# Patient Record
Sex: Male | Born: 1970 | Race: White | Hispanic: No | Marital: Married | Smoking: Former smoker
Health system: Southern US, Community
[De-identification: ages and names within clinical notes are randomized; demographics above are authoritative.]

## PROBLEM LIST (undated history)

## (undated) DIAGNOSIS — I484 Atypical atrial flutter: Secondary | ICD-10-CM

## (undated) DIAGNOSIS — Q249 Congenital malformation of heart, unspecified: Secondary | ICD-10-CM

## (undated) HISTORY — PX: OTHER SURGICAL HISTORY: SHX169

## (undated) HISTORY — DX: Atypical atrial flutter: I48.4

## (undated) HISTORY — PX: ABLATION: SHX5711

## (undated) HISTORY — DX: Congenital malformation of heart, unspecified: Q24.9

---

## 2005-11-02 HISTORY — PX: CARDIAC CATHETERIZATION: SHX172

## 2006-02-27 ENCOUNTER — Inpatient Hospital Stay (HOSPITAL_COMMUNITY): Admission: EM | Admit: 2006-02-27 | Discharge: 2006-03-02 | Payer: Self-pay | Admitting: *Deleted

## 2006-02-27 ENCOUNTER — Ambulatory Visit: Payer: Self-pay | Admitting: *Deleted

## 2006-03-11 ENCOUNTER — Ambulatory Visit: Payer: Self-pay | Admitting: Internal Medicine

## 2006-04-05 ENCOUNTER — Ambulatory Visit: Payer: Self-pay | Admitting: Internal Medicine

## 2006-04-06 ENCOUNTER — Observation Stay (HOSPITAL_COMMUNITY): Admission: AD | Admit: 2006-04-06 | Discharge: 2006-04-07 | Payer: Self-pay | Admitting: Internal Medicine

## 2006-04-06 ENCOUNTER — Ambulatory Visit: Payer: Self-pay | Admitting: Internal Medicine

## 2006-04-09 ENCOUNTER — Ambulatory Visit: Payer: Self-pay | Admitting: Cardiology

## 2006-04-20 ENCOUNTER — Ambulatory Visit: Payer: Self-pay | Admitting: Cardiology

## 2006-04-29 ENCOUNTER — Ambulatory Visit: Payer: Self-pay | Admitting: Internal Medicine

## 2010-10-20 ENCOUNTER — Telehealth (INDEPENDENT_AMBULATORY_CARE_PROVIDER_SITE_OTHER): Payer: Self-pay | Admitting: *Deleted

## 2010-12-04 NOTE — Progress Notes (Signed)
  Request received from [facility] [employer] sent to [employer]  October 20, 2010 9:20 AM

## 2014-05-28 ENCOUNTER — Ambulatory Visit (HOSPITAL_COMMUNITY)
Admission: RE | Admit: 2014-05-28 | Discharge: 2014-05-28 | Disposition: A | Discharge status: Home / Self Care | Payer: BC Managed Care – PPO | Source: Ambulatory Visit | Attending: Cardiology | Admitting: Cardiology

## 2014-05-28 ENCOUNTER — Telehealth: Payer: Self-pay

## 2014-05-28 ENCOUNTER — Ambulatory Visit (INDEPENDENT_AMBULATORY_CARE_PROVIDER_SITE_OTHER): Payer: BC Managed Care – PPO | Admitting: Cardiology

## 2014-05-28 ENCOUNTER — Encounter: Payer: Self-pay | Admitting: Cardiology

## 2014-05-28 VITALS — BP 135/80 | HR 85 | Ht 73.0 in | Wt 236.0 lb

## 2014-05-28 DIAGNOSIS — Z87891 Personal history of nicotine dependence: Secondary | ICD-10-CM | POA: Insufficient documentation

## 2014-05-28 DIAGNOSIS — Z9889 Other specified postprocedural states: Secondary | ICD-10-CM | POA: Insufficient documentation

## 2014-05-28 DIAGNOSIS — I079 Rheumatic tricuspid valve disease, unspecified: Secondary | ICD-10-CM | POA: Insufficient documentation

## 2014-05-28 DIAGNOSIS — I059 Rheumatic mitral valve disease, unspecified: Secondary | ICD-10-CM

## 2014-05-28 DIAGNOSIS — I4892 Unspecified atrial flutter: Secondary | ICD-10-CM

## 2014-05-28 DIAGNOSIS — I484 Atypical atrial flutter: Secondary | ICD-10-CM

## 2014-05-28 DIAGNOSIS — I10 Essential (primary) hypertension: Secondary | ICD-10-CM

## 2014-05-28 DIAGNOSIS — Q249 Congenital malformation of heart, unspecified: Secondary | ICD-10-CM | POA: Insufficient documentation

## 2014-05-28 DIAGNOSIS — I4891 Unspecified atrial fibrillation: Secondary | ICD-10-CM | POA: Insufficient documentation

## 2014-05-28 DIAGNOSIS — Z01810 Encounter for preprocedural cardiovascular examination: Secondary | ICD-10-CM | POA: Insufficient documentation

## 2014-05-28 DIAGNOSIS — I517 Cardiomegaly: Secondary | ICD-10-CM | POA: Insufficient documentation

## 2014-05-28 DIAGNOSIS — Z01818 Encounter for other preprocedural examination: Secondary | ICD-10-CM

## 2014-05-28 NOTE — Assessment & Plan Note (Signed)
Details are not clear. An echocardiogram is being obtained.

## 2014-05-28 NOTE — Telephone Encounter (Signed)
Patient self referred  Need clearance for surgery  Fax to [phone]   Patient hasnt been here

## 2014-05-28 NOTE — Patient Instructions (Signed)
Your physician recommends that you schedule a follow-up appointment in: to be determined  Your physician has requested that you have an echocardiogram. Echocardiography is a painless test that uses sound waves to create images of your heart. It provides your doctor with information about the size and shape of your heart and how well your heart's chambers and valves are working. This procedure takes approximately one hour. There are no restrictions for this procedure.    

## 2014-05-28 NOTE — Progress Notes (Signed)
Clinical Summary Mr. Spangle is a 43 y.o.male referred by Dr. Madelon Lipsaffrey with [facility] for preoperative consultation. He is being considered for elective arthroscopic right shoulder surgery this Wednesday, July 29 (2 days from now).  Limited old records are available, we were able to obtain some from [facility] archives. Patient has a reported history of congenital heart disease, undergoing surgery as a child in [place]. Previous notes are discrepant, one describing a history of Tetralogy of Fallot repair, the other describing repair of pulmonic stenosis only. He also has a previous history of radiofrequency ablation of atypical atrial flutter, has not required long-term anticoagulation. There has been no regular cardiology followup since 2007. No old ECG available for comparison, and could not locate previous echocardiogram report.  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 States that he is active with his family, enjoys mountain biking and also running (up to 5 miles at a time), with no major functional limitation. He has NYHA class I dyspnea, no exertional chest pain, no palpitations or syncope. He has no cyanosis, no orthopnea, no leg edema.  ECG today shows sinus rhythm with intermittent junctional beats, possible left atrial enlargement and borderline increased voltage, leftward axis.  No Known Allergies  No current prescription medications.  Past Medical History  Diagnosis Date  . Congenital heart disease     Reportedly either Tetralogy of Fallot repair or pulmonic stenosis repair in childhood [facility]([place]) - details not clear  . Atypical atrial flutter     Status post RFA by Dr. Ladona Ridgelaylor 2007    Past Surgical History  Procedure Laterality Date  . Congenital heart disease surgery      [place] - childhood    Family History  Problem Relation Age of Onset  . CAD Mother     At age 10057    Social History Mr. Margerum reports that he has quit smoking. His  smoking use included Cigarettes. He smoked 0.00 packs per day. He does not have any smokeless tobacco history on file. Mr. Lorenso Courierowers reports that he drinks alcohol.  Review of Systems Right shoulder discomfort after having a fall and bracing himself with his right arm. Other systems reviewed and negative.  Physical Examination Filed Vitals:   05/28/14 1314  BP: 135/80  Pulse: 85   Filed Weights   05/28/14 1314  Weight: 236 lb (107.049 kg)   Normally nourished appearing male, comfortable at rest. HEENT: Conjunctiva and lids normal, oropharynx clear. Neck: Supple, no elevated JVP or carotid bruits, no thyromegaly. Lungs: Clear to auscultation, nonlabored breathing at rest. Cardiac: Regular rate and rhythm, no S3, soft systolic murmur at left base, no obvious diastolic murmur, no pericardial rub. Abdomen: Soft, nontender, bowel sounds present, no guarding or rebound. Extremities: No pitting edema, distal pulses 2+. No cyanosis. No clubbing. Skin: Warm and dry. Musculoskeletal: No kyphosis. Neuropsychiatric: Alert and oriented x3, affect grossly appropriate.   Problem List and Plan   Preoperative clearance Patient being considered for elective right shoulder arthroscopic surgery this Wednesday. He reports no significant functional limitations as detailed above with activities including exercise well exceeding 4 METs. His ECG shows sinus rhythm with intermittent junctional beats. Prior cardiac history includes radiofrequency ablation of atypical atrial flutter which has not been a recurrent issue since 2007. He is no longer anticoagulated. Also history of congenital heart disease with incomplete information available. 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  He only had one operation. We will obtain an echocardiogram to get a better sense of cardiac structure and function. Unless there are any significant  residual shunts that would increase his propensity for cyanosis or hypoxia, or other valvular lesions that need further investigation, he should he be able to proceed with surgery on Wednesday. We will call him with the results and forward a copy of the echocardiogram to Dr. Madelon Lips.  Atypical atrial flutter Clinically stable following radiofrequency ablation in 2007 by Dr. Ladona Ridgel. No longer anticoagulated. He is in sinus rhythm today.  Congenital heart disease Details are not clear. An echocardiogram is being obtained.    Jonelle Sidle, M.D., F.A.C.C.

## 2014-05-28 NOTE — Telephone Encounter (Signed)
Please tell patient he needs to make an appointment to be seen and then we can do the referral that he needs to have for surgery. He may also need an okay from his cardiologist. What kind of surgery is going to have?

## 2014-05-28 NOTE — Assessment & Plan Note (Signed)
Clinically stable following radiofrequency ablation in 2007 by Dr. Ladona Ridgelaylor. No longer anticoagulated. He is in sinus rhythm today.

## 2014-05-28 NOTE — Assessment & Plan Note (Signed)
Patient being considered for elective right shoulder arthroscopic surgery this Wednesday. He reports no significant functional limitations as detailed above with activities including exercise well exceeding 4 METs. His ECG shows sinus rhythm with intermittent junctional beats. Prior cardiac history includes radiofrequency ablation of atypical atrial flutter which has not been a recurrent issue since 2007. He is no longer anticoagulated. Also history of congenital heart disease with incomplete information available. 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 He only had one operation. We will obtain an echocardiogram to get a better sense of cardiac structure and function. Unless there are any significant residual shunts that would increase his propensity for cyanosis or hypoxia, or other valvular lesions that need further investigation, he should he be able to proceed with surgery on Wednesday. We will call him with the results and forward a copy of the echocardiogram to Dr. Madelon Lipsaffrey.

## 2014-05-29 ENCOUNTER — Encounter: Payer: Self-pay | Admitting: Cardiology

## 2014-05-29 NOTE — Progress Notes (Signed)
Realized that an omission error occurred in my note from 05/28/2014 at 5:37 PM discussing the echocardiogram report. It should have read as follows:  Echocardiogram reviewed. It is possible that he had a Tetrology of Fallot repair, but difficult to be sure without more information or prior study. Importantly, there are no significant shunts (no VSD or ASD) and right ventricular function is normal. 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 No clear indication for SBE prophylaxis in this setting (not six months from heart surgery, no shunt noted, not procedure dealing with infected tissue). We should see him back in a year with echocardiogram, sooner if he develops any exertional symptoms. Forward copy of my office note and this addendum to Dr. Madelon Lipsaffrey.  Jonelle SidleSamuel G. Barnabas Curry, M.D., F.A.C.C.

## 2014-05-30 ENCOUNTER — Encounter (HOSPITAL_COMMUNITY): Payer: Self-pay | Admitting: Pharmacy Technician

## 2014-05-30 NOTE — Pre-Procedure Instructions (Signed)
Joshua Curry  05/30/2014   Your procedure is scheduled on:  Friday July 31 th at 1000 AM  [facility] Admitting at 0800 AM.  Call this number if you have problems the morning of surgery: [phone]   Remember:   Do not eat food or drink liquids after midnight Thursday   Take these medicines the morning of surgery with A SIP OF WATER: None   Do not wear jewelry  Do not wear lotions, powders, or colognes. You may wear deodorant.   Men may shave face and neck.  Do not bring valuables to the hospital.  [facility] is not responsible  for any belongings or valuables.               Contacts, dentures or bridgework may not be worn into surgery.  Leave suitcase in the car. After surgery it may be brought to your room.  For patients admitted to the hospital, discharge time is determined by your   treatment team.               Patients discharged the day of surgery will not be allowed to drive home.    Special Instructions: [facility] - Preparing for Surgery  Before surgery, you can play an important role.  Because skin is not sterile, your skin needs to be as free of germs as possible.  You can reduce the number of germs on you skin by washing with CHG (chlorahexidine gluconate) soap before surgery.  CHG is an antiseptic cleaner which kills germs and bonds with the skin to continue killing germs even after washing.  Please DO NOT use if you have an allergy to CHG or antibacterial soaps.  If your skin becomes reddened/irritated stop using the CHG and inform your nurse when you arrive at Short Stay.  Do not shave (including legs and underarms) for at least 48 hours prior to the first CHG shower.  You may shave your face.  Please follow these instructions carefully:   1.  Shower with CHG Soap the night before surgery and the                                morning of Surgery.  2.  If you choose to wash your hair, wash your hair first as usual with your       normal  shampoo.  3.  After you shampoo, rinse your hair and body thoroughly to remove the                      Shampoo.  4.  Use CHG as you would any other liquid soap.  You can apply chg directly       to the skin and wash gently with scrungie or a clean washcloth.  5.  Apply the CHG Soap to your body ONLY FROM THE NECK DOWN.        Do not use on open wounds or open sores.  Avoid contact with your eyes,       ears, mouth and genitals (private parts).  Wash genitals (private parts)       with your normal soap.  6.  Wash thoroughly, paying special attention to the area where your surgery        will be performed.  7.  Thoroughly rinse your body with warm water from the neck down.  8.  DO NOT shower/wash with your normal soap after using and rinsing off       the CHG Soap.  9.  Pat yourself dry with a clean towel.            10.  Wear clean pajamas.            11.  Place clean sheets on your bed the night of your first shower and do not        sleep with pets.  Day of Surgery  Do not apply any lotions/deoderants the morning of surgery.  Please wear clean clothes to the hospital/surgery center.      Please read over the following fact sheets that you were given: Pain Booklet, Coughing and Deep Breathing and Surgical Site Infection Prevention

## 2014-05-31 ENCOUNTER — Other Ambulatory Visit: Payer: Self-pay | Admitting: Physician Assistant

## 2014-05-31 ENCOUNTER — Encounter (HOSPITAL_COMMUNITY)
Admission: RE | Admit: 2014-05-31 | Discharge: 2014-05-31 | Disposition: A | Discharge status: Home / Self Care | Payer: BC Managed Care – PPO | Source: Ambulatory Visit | Attending: Anesthesiology | Admitting: Anesthesiology

## 2014-05-31 ENCOUNTER — Encounter (HOSPITAL_COMMUNITY): Payer: Self-pay

## 2014-05-31 ENCOUNTER — Encounter (HOSPITAL_COMMUNITY)
Admission: RE | Admit: 2014-05-31 | Discharge: 2014-05-31 | Disposition: A | Discharge status: Home / Self Care | Payer: BC Managed Care – PPO | Source: Ambulatory Visit | Attending: Orthopedic Surgery | Admitting: Orthopedic Surgery

## 2014-05-31 DIAGNOSIS — S43439A Superior glenoid labrum lesion of unspecified shoulder, initial encounter: Secondary | ICD-10-CM | POA: Diagnosis present

## 2014-05-31 DIAGNOSIS — M24119 Other articular cartilage disorders, unspecified shoulder: Secondary | ICD-10-CM | POA: Diagnosis not present

## 2014-05-31 DIAGNOSIS — M25819 Other specified joint disorders, unspecified shoulder: Secondary | ICD-10-CM | POA: Diagnosis not present

## 2014-05-31 DIAGNOSIS — Z01812 Encounter for preprocedural laboratory examination: Secondary | ICD-10-CM | POA: Diagnosis not present

## 2014-05-31 DIAGNOSIS — Z87891 Personal history of nicotine dependence: Secondary | ICD-10-CM | POA: Diagnosis not present

## 2014-05-31 DIAGNOSIS — M19019 Primary osteoarthritis, unspecified shoulder: Secondary | ICD-10-CM | POA: Diagnosis not present

## 2014-05-31 DIAGNOSIS — W108XXA Fall (on) (from) other stairs and steps, initial encounter: Secondary | ICD-10-CM | POA: Diagnosis not present

## 2014-05-31 DIAGNOSIS — Z01818 Encounter for other preprocedural examination: Secondary | ICD-10-CM | POA: Diagnosis not present

## 2014-05-31 LAB — CBC
HCT: 45.1 % (ref 39.0–52.0)
Hemoglobin: 15.6 g/dL (ref 13.0–17.0)
MCH: 30.7 pg (ref 26.0–34.0)
MCHC: 34.6 g/dL (ref 30.0–36.0)
MCV: 88.8 fL (ref 78.0–100.0)
Platelets: 303 10*3/uL (ref 150–400)
RBC: 5.08 MIL/uL (ref 4.22–5.81)
RDW: 13.4 % (ref 11.5–15.5)
WBC: 7.6 10*3/uL (ref 4.0–10.5)

## 2014-05-31 LAB — BASIC METABOLIC PANEL
ANION GAP: 15 (ref 5–15)
BUN: 13 mg/dL (ref 6–23)
CALCIUM: 9.2 mg/dL (ref 8.4–10.5)
CO2: 24 meq/L (ref 19–32)
CREATININE: 0.84 mg/dL (ref 0.50–1.35)
Chloride: 101 mEq/L (ref 96–112)
GFR calc Af Amer: >90 mL/min (ref >90)
GLUCOSE: 101 mg/dL — AB (ref 70–99)
POTASSIUM: 4.3 meq/L (ref 3.7–5.3)
Sodium: 140 mEq/L (ref 137–147)

## 2014-05-31 MED ORDER — CEFAZOLIN SODIUM-DEXTROSE 2-3 GM-% IV SOLR
2.0000 g | INTRAVENOUS | Status: AC
  Administered 2014-06-01: 2 g via INTRAVENOUS
  Filled 2014-05-31: qty 50

## 2014-05-31 MED ORDER — CHLORHEXIDINE GLUCONATE 4 % EX LIQD: 60.0000 mL | Freq: Once | CUTANEOUS | Status: DC

## 2014-05-31 MED ORDER — LACTATED RINGERS IV SOLN
INTRAVENOUS | Status: DC
  Administered 2014-06-01: 09:00:00 via INTRAVENOUS

## 2014-05-31 NOTE — H&P (Signed)
Joshua Curry is an 43 y.o. male.   Chief Complaint: right shoulder pain HPI: Right shoulder pain. He fell going up some steps, pain around the clavicle and anterior aspect of the shoulder. He is relatively stoic. 43 year old does not do physical work. He has occasional clicking. He works a sedentary job. 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  Past Medical History  Diagnosis Date  . 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 Atypical atrial flutter     Status post RFA by Dr. Lovena Le 2007    Past Surgical History  Procedure Laterality Date  . Congenital heart disease surgery      [place] - childhood  . Ablation      atrial flutter  . Cardiac catheterization  2007    Family History  Problem Relation Age of Onset  . CAD Mother     At age 28   Social History:  reports that he has quit smoking. His smoking use included Cigarettes. He smoked 0.00 packs per day. He has never used smokeless tobacco. He reports that he drinks alcohol. He reports that he does not use illicit drugs.  Allergies: No Known Allergies   (Not in a hospital admission)  Results for orders placed during the hospital encounter of 05/31/14 (from the past 48 hour(s))  BASIC METABOLIC PANEL     Status: Abnormal   Collection Time    05/31/14  9:50 AM      Result Value Ref Range   Sodium 140  137 - 147 mEq/L   Potassium 4.3  3.7 - 5.3 mEq/L   Chloride 101  96 - 112 mEq/L   CO2 24  19 - 32 mEq/L   Glucose, Bld 101 (*) 70 - 99 mg/dL   BUN 13  6 - 23 mg/dL   Creatinine, Ser 0.84  0.50 - 1.35 mg/dL   Calcium 9.2  8.4 - 10.5 mg/dL   GFR calc non Af Amer >90  >90 mL/min   GFR calc Af Amer >90  >90 mL/min   Comment: (NOTE)     The eGFR has been calculated using the CKD EPI equation.     This calculation has not been validated in all clinical situations.     eGFR's persistently <90 mL/min  signify possible Chronic Kidney     Disease.   Anion gap 15  5 - 15  CBC     Status: None   Collection Time    05/31/14  9:50 AM      Result Value Ref Range   WBC 7.6  4.0 - 10.5 K/uL   RBC 5.08  4.22 - 5.81 MIL/uL   Hemoglobin 15.6  13.0 - 17.0 g/dL   HCT 45.1  39.0 - 52.0 %   MCV 88.8  78.0 - 100.0 fL   MCH 30.7  26.0 - 34.0 pg   MCHC 34.6  30.0 - 36.0 g/dL   RDW 13.4  11.5 - 15.5 %   Platelets 303  150 - 400 K/uL   Dg Chest 2 View  05/31/2014   CLINICAL DATA:  Right shoulder surgery.  EXAM: CHEST  2 VIEW  COMPARISON:  CT 02/27/2006.  Chest x-ray 02/27/2006  FINDINGS: Mediastinum hilar structures normal. Lungs are clear. Heart size stable. Prior CABG. Normal pulmonary vascularity. No acute bony abnormality.  IMPRESSION: No active  cardiopulmonary disease.   Electronically Signed   By: Marcello Moores  Register   On: 05/31/2014 10:27    ROS  There were no vitals taken for this visit. Physical Exam  Constitutional: He is oriented to person, place, and time. He appears well-developed and well-nourished. No distress.  HENT:  Head: Normocephalic and atraumatic.  Nose: Nose normal.  Eyes: Conjunctivae and EOM are normal. Pupils are equal, round, and reactive to light.  Neck: Normal range of motion. Neck supple.  Cardiovascular: Normal rate and intact distal pulses.   Respiratory: Effort normal. No respiratory distress.  GI: Soft. He exhibits no distension. There is no tenderness.  Musculoskeletal:       Right shoulder: He exhibits decreased range of motion, tenderness, pain and decreased strength.  Lymphadenopathy:    He has no cervical adenopathy.  Neurological: He is alert and oriented to person, place, and time. No cranial nerve deficit.  Skin: Skin is warm and dry. No rash noted. No erythema.  Psychiatric: He has a normal mood and affect. His behavior is normal.     Assessment/Plan He has a superior labral tear minimal tendinosis partial articular cartilage loss. At this point he  has a superior labral tear which I think is symptomatic, probable candidate for shoulder arthroscopy debridement possible labral repair more likely biceps tenodesis. Risks and benefits discussed with the patient proceed on with scheduling. Went over the findings on MRI all questions answered. Per peri and post-op issues were discussed as well  Chavon Lucarelli, Vonna Kotyk 05/31/2014, 5:29 PM

## 2014-05-31 NOTE — Progress Notes (Signed)
Anesthesia Chart Review:  Patient is a 43 year old male scheduled for right shoulder arthroscopy with debridement, possible slap repair, possible biceps tenodesis on 06/01/14 by Dr. Madelon Lipsaffrey.  History includes congenital heart defect (possible Tetralogy of Fallot or pulmonic stenosis) s/p repair in [place], aflutter s/p radiofrequency ablation (2007, Dr. Lewayne BuntingGregg Taylor).  He saw cardiologist Dr. Diona BrownerMcDowell this week for preoperative evaluation.  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  PCP is Dr. Rudi Heaponald Moore.  Echo on 05/28/14 showed: - Left ventricle: The cavity size was normal. Wall thickness was increased in a pattern of mild LVH. Systolic function was normal. The estimated ejection fraction was in the range of 60% to 65%. Wall motion was normal; there were no regional wall motion abnormalities. Left ventricular diastolic function parameters were normal. - Aortic valve: There was no significant regurgitation. - Mitral valve: There was mild regurgitation. - Left atrium: The atrium was mildly dilated. - Right atrium: The atrium was moderately dilated. Central venous pressure (est): 8 mm Hg. - Atrial septum: No defect or patent foramen ovale was identified. - Tricuspid valve: There was mild regurgitation. - Pulmonic valve: Poorly visualized - leaflets appear pliable. Transvalvular velocity was increased. Suggests mild stenosis, although level not clear (valvular or infudibular). There was moderate regurgitation. Peak gradient (S): 16 mm Hg. - Pulmonary arteries: PA peak pressure: 30 mm Hg (S). - Pericardium, extracardiac: There was no pericardial effusion. Impressions: Incomplete historical information available. Reported history of Tetrology of Fallot repair versus pulmonic stenosis repair. Mild LVH with LVEF 60-65%, normal diastolic function. Mild left atrial and moderate right atrial enlargement. Mild mitral and tricuspid regurgitation.  Upper normal PASP 30 mmHg. The transpulmonic velocity is increased in mildly stenotic range and there is probable moderate pulmonic regurgitation - visualized portion of leaflets look pliable. There is also linear thickening in the region of the basal ventricular septum. RV size and contraction are normal. 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 No residual shunts are noted. Pulmonic regurgitation would be a typical sequella. (Echocardiogram reviewed by Dr. Diona BrownerMcDowell who wrote: "It is possible that he had a Tetrology of Fallot repair, but difficult to be sure without more information or prior study. Importantly, there are no significant shunts (no VSD or ASD) and right ventricular function is normal. 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 No clear indication for SBE prophylaxis in this setting (not six months from heart surgery, no shunt noted, not procedure dealing with infected tissue). We should see him back in a year with echocardiogram, sooner if he develops any exertional symptoms. Forward copy of my office note and this addendum to Dr. Madelon Lipsaffrey."  EKG on 05/28/14 showed: SR with marked sinus arrhythmia with junctional escape complexes.  Possible LAE.  LVH with repolarization abnormality. Lateral infarct, age undetermined. Inferior infarct, age undetermined.  CXR report on 05/31/14 showed: Mediastinum hilar structures normal. Lungs are clear. Heart size stable. Prior CABG. Normal pulmonary vascularity. No acute bony abnormality. IMPRESSION: No active cardiopulmonary disease.   Preoperative labs noted.  Anticipate that he can proceed as planned.  Velna Ochsllison Samayra Hebel, PA-C [facility] Stay [facility]/Anesthesiology Phone [phone] 05/31/2014 12:41 PM

## 2014-06-01 ENCOUNTER — Encounter (HOSPITAL_COMMUNITY): Payer: Self-pay | Admitting: *Deleted

## 2014-06-01 ENCOUNTER — Ambulatory Visit (HOSPITAL_COMMUNITY)
Admission: RE | Admit: 2014-06-01 | Discharge: 2014-06-02 | Disposition: A | Discharge status: Home / Self Care | Payer: BC Managed Care – PPO | Source: Ambulatory Visit | Attending: Orthopedic Surgery | Admitting: Orthopedic Surgery

## 2014-06-01 ENCOUNTER — Encounter (HOSPITAL_COMMUNITY)
Admission: RE | Disposition: A | Discharge status: Home / Self Care | Payer: Self-pay | Source: Ambulatory Visit | Attending: Orthopedic Surgery

## 2014-06-01 ENCOUNTER — Ambulatory Visit (HOSPITAL_COMMUNITY): Payer: BC Managed Care – PPO | Admitting: Anesthesiology

## 2014-06-01 ENCOUNTER — Encounter (HOSPITAL_COMMUNITY): Payer: BC Managed Care – PPO | Admitting: Vascular Surgery

## 2014-06-01 DIAGNOSIS — Z87891 Personal history of nicotine dependence: Secondary | ICD-10-CM | POA: Insufficient documentation

## 2014-06-01 DIAGNOSIS — Z01812 Encounter for preprocedural laboratory examination: Secondary | ICD-10-CM | POA: Insufficient documentation

## 2014-06-01 DIAGNOSIS — M25819 Other specified joint disorders, unspecified shoulder: Secondary | ICD-10-CM | POA: Insufficient documentation

## 2014-06-01 DIAGNOSIS — M7580 Other shoulder lesions, unspecified shoulder: Secondary | ICD-10-CM

## 2014-06-01 DIAGNOSIS — M24119 Other articular cartilage disorders, unspecified shoulder: Secondary | ICD-10-CM | POA: Insufficient documentation

## 2014-06-01 DIAGNOSIS — W108XXA Fall (on) (from) other stairs and steps, initial encounter: Secondary | ICD-10-CM | POA: Insufficient documentation

## 2014-06-01 DIAGNOSIS — Z01818 Encounter for other preprocedural examination: Secondary | ICD-10-CM | POA: Insufficient documentation

## 2014-06-01 DIAGNOSIS — M19019 Primary osteoarthritis, unspecified shoulder: Secondary | ICD-10-CM | POA: Insufficient documentation

## 2014-06-01 DIAGNOSIS — S43439A Superior glenoid labrum lesion of unspecified shoulder, initial encounter: Secondary | ICD-10-CM | POA: Insufficient documentation

## 2014-06-01 HISTORY — PX: SHOULDER ARTHROSCOPY WITH BICEPS TENDON REPAIR: SHX5674

## 2014-06-01 SURGERY — SHOULDER ARTHROSCOPY WITH BICEPS TENDON REPAIR
Anesthesia: General | Site: Shoulder | Laterality: Right

## 2014-06-01 MED ORDER — EPHEDRINE SULFATE 50 MG/ML IJ SOLN
INTRAMUSCULAR | Status: AC
  Filled 2014-06-01: qty 1

## 2014-06-01 MED ORDER — MIDAZOLAM HCL 2 MG/2ML IJ SOLN
INTRAMUSCULAR | Status: AC
  Filled 2014-06-01: qty 2

## 2014-06-01 MED ORDER — SODIUM CHLORIDE 0.9 % IV SOLN
INTRAVENOUS | Status: DC
  Administered 2014-06-01: 21:00:00 via INTRAVENOUS

## 2014-06-01 MED ORDER — FENTANYL CITRATE 0.05 MG/ML IJ SOLN
INTRAMUSCULAR | Status: AC
  Filled 2014-06-01: qty 5

## 2014-06-01 MED ORDER — MIDAZOLAM HCL 2 MG/2ML IJ SOLN
INTRAMUSCULAR | Status: AC
  Administered 2014-06-01: 2 mg via INTRAVENOUS
  Filled 2014-06-01: qty 2

## 2014-06-01 MED ORDER — FENTANYL CITRATE 0.05 MG/ML IJ SOLN
INTRAMUSCULAR | Status: AC
  Administered 2014-06-01: 100 ug via INTRAVENOUS
  Filled 2014-06-01: qty 2

## 2014-06-01 MED ORDER — DOCUSATE SODIUM 100 MG PO CAPS
100.0000 mg | ORAL_CAPSULE | Freq: Two times a day (BID) | ORAL | Status: DC
  Administered 2014-06-01: 100 mg via ORAL
  Filled 2014-06-01 (×2): qty 1

## 2014-06-01 MED ORDER — METOCLOPRAMIDE HCL 5 MG/ML IJ SOLN: 5.0000 mg | Freq: Three times a day (TID) | INTRAMUSCULAR | Status: DC | PRN

## 2014-06-01 MED ORDER — PROPOFOL 10 MG/ML IV BOLUS
INTRAVENOUS | Status: DC | PRN
  Administered 2014-06-01: 20 mg via INTRAVENOUS
  Administered 2014-06-01: 180 mg via INTRAVENOUS

## 2014-06-01 MED ORDER — ROCURONIUM BROMIDE 50 MG/5ML IV SOLN
INTRAVENOUS | Status: AC
  Filled 2014-06-01: qty 1

## 2014-06-01 MED ORDER — LIDOCAINE HCL (CARDIAC) 20 MG/ML IV SOLN
INTRAVENOUS | Status: AC
  Filled 2014-06-01: qty 5

## 2014-06-01 MED ORDER — LACTATED RINGERS IV SOLN
INTRAVENOUS | Status: DC | PRN
  Administered 2014-06-01 (×2): via INTRAVENOUS

## 2014-06-01 MED ORDER — ONDANSETRON HCL 4 MG/2ML IJ SOLN
INTRAMUSCULAR | Status: AC
  Filled 2014-06-01: qty 2

## 2014-06-01 MED ORDER — FENTANYL CITRATE 0.05 MG/ML IJ SOLN
INTRAMUSCULAR | Status: DC | PRN
  Administered 2014-06-01: 100 ug via INTRAVENOUS
  Administered 2014-06-01: 50 ug via INTRAVENOUS

## 2014-06-01 MED ORDER — NEOSTIGMINE METHYLSULFATE 10 MG/10ML IV SOLN
INTRAVENOUS | Status: AC
  Filled 2014-06-01: qty 1

## 2014-06-01 MED ORDER — LIDOCAINE HCL (CARDIAC) 20 MG/ML IV SOLN
INTRAVENOUS | Status: DC | PRN
  Administered 2014-06-01: 60 mg via INTRAVENOUS

## 2014-06-01 MED ORDER — NEOSTIGMINE METHYLSULFATE 10 MG/10ML IV SOLN
INTRAVENOUS | Status: DC | PRN
  Administered 2014-06-01: 3 mg via INTRAVENOUS

## 2014-06-01 MED ORDER — GLYCOPYRROLATE 0.2 MG/ML IJ SOLN
INTRAMUSCULAR | Status: AC
  Filled 2014-06-01: qty 2

## 2014-06-01 MED ORDER — ROCURONIUM BROMIDE 100 MG/10ML IV SOLN
INTRAVENOUS | Status: DC | PRN
  Administered 2014-06-01: 50 mg via INTRAVENOUS

## 2014-06-01 MED ORDER — METHYLPREDNISOLONE ACETATE 80 MG/ML IJ SUSP
INTRAMUSCULAR | Status: DC | PRN
  Administered 2014-06-01: 80 mg via INTRA_ARTICULAR

## 2014-06-01 MED ORDER — FENTANYL CITRATE 0.05 MG/ML IJ SOLN
100.0000 ug | Freq: Once | INTRAMUSCULAR | Status: AC
  Administered 2014-06-01: 100 ug via INTRAVENOUS

## 2014-06-01 MED ORDER — METOCLOPRAMIDE HCL 10 MG PO TABS: 5.0000 mg | ORAL_TABLET | Freq: Three times a day (TID) | ORAL | Status: DC | PRN

## 2014-06-01 MED ORDER — MIDAZOLAM HCL 2 MG/2ML IJ SOLN
2.0000 mg | Freq: Once | INTRAMUSCULAR | Status: AC
  Administered 2014-06-01: 2 mg via INTRAVENOUS

## 2014-06-01 MED ORDER — SODIUM CHLORIDE 0.9 % IR SOLN
Status: DC | PRN
  Administered 2014-06-01 (×2): 3000 mL

## 2014-06-01 MED ORDER — ONDANSETRON HCL 4 MG/2ML IJ SOLN
4.0000 mg | Freq: Once | INTRAMUSCULAR | Status: DC | PRN
Start: 2014-06-01 — End: 2014-06-01

## 2014-06-01 MED ORDER — SODIUM CHLORIDE 0.9 % IJ SOLN
INTRAMUSCULAR | Status: AC
  Filled 2014-06-01: qty 10

## 2014-06-01 MED ORDER — GLYCOPYRROLATE 0.2 MG/ML IJ SOLN
INTRAMUSCULAR | Status: DC | PRN
  Administered 2014-06-01: 0.4 mg via INTRAVENOUS
  Administered 2014-06-01: 0.2 mg via INTRAVENOUS

## 2014-06-01 MED ORDER — PROPOFOL 10 MG/ML IV BOLUS
INTRAVENOUS | Status: AC
  Filled 2014-06-01: qty 20

## 2014-06-01 MED ORDER — ONDANSETRON HCL 4 MG PO TABS: 4.0000 mg | ORAL_TABLET | Freq: Four times a day (QID) | ORAL | Status: DC | PRN

## 2014-06-01 MED ORDER — BUPIVACAINE-EPINEPHRINE (PF) 0.25% -1:200000 IJ SOLN
INTRAMUSCULAR | Status: AC
  Filled 2014-06-01: qty 30

## 2014-06-01 MED ORDER — ONDANSETRON HCL 4 MG/2ML IJ SOLN: 4.0000 mg | Freq: Four times a day (QID) | INTRAMUSCULAR | Status: DC | PRN

## 2014-06-01 MED ORDER — HYDROMORPHONE HCL PF 1 MG/ML IJ SOLN: 0.5000 mg | INTRAMUSCULAR | Status: DC | PRN

## 2014-06-01 MED ORDER — METHYLPREDNISOLONE ACETATE 80 MG/ML IJ SUSP
INTRAMUSCULAR | Status: AC
  Filled 2014-06-01: qty 1

## 2014-06-01 MED ORDER — CEFAZOLIN SODIUM-DEXTROSE 2-3 GM-% IV SOLR
2.0000 g | Freq: Four times a day (QID) | INTRAVENOUS | Status: AC
  Administered 2014-06-01 – 2014-06-02 (×3): 2 g via INTRAVENOUS
  Filled 2014-06-01 (×3): qty 50

## 2014-06-01 MED ORDER — KETOROLAC TROMETHAMINE 30 MG/ML IJ SOLN
INTRAMUSCULAR | Status: DC | PRN
  Administered 2014-06-01: 30 mg via INTRAVENOUS

## 2014-06-01 MED ORDER — OXYCODONE-ACETAMINOPHEN 5-325 MG PO TABS
1.0000 | ORAL_TABLET | ORAL | Status: DC | PRN
  Administered 2014-06-01: 2 via ORAL
  Administered 2014-06-01: 1 via ORAL
  Administered 2014-06-02 (×2): 2 via ORAL
  Filled 2014-06-01 (×4): qty 2

## 2014-06-01 MED ORDER — ONDANSETRON HCL 4 MG/2ML IJ SOLN
INTRAMUSCULAR | Status: DC | PRN
  Administered 2014-06-01: 4 mg via INTRAVENOUS

## 2014-06-01 MED ORDER — BUPIVACAINE-EPINEPHRINE 0.5% -1:200000 IJ SOLN
INTRAMUSCULAR | Status: DC | PRN
  Administered 2014-06-01: 5 mL

## 2014-06-01 MED ORDER — KETOROLAC TROMETHAMINE 30 MG/ML IJ SOLN
INTRAMUSCULAR | Status: AC
  Filled 2014-06-01: qty 1

## 2014-06-01 MED ORDER — FENTANYL CITRATE 0.05 MG/ML IJ SOLN: 25.0000 ug | INTRAMUSCULAR | Status: DC | PRN

## 2014-06-01 MED ORDER — METHOCARBAMOL 1000 MG/10ML IJ SOLN
500.0000 mg | Freq: Four times a day (QID) | INTRAVENOUS | Status: DC | PRN
  Filled 2014-06-01: qty 5

## 2014-06-01 MED ORDER — METHOCARBAMOL 500 MG PO TABS
500.0000 mg | ORAL_TABLET | Freq: Four times a day (QID) | ORAL | Status: DC | PRN
  Administered 2014-06-01 – 2014-06-02 (×2): 500 mg via ORAL
  Filled 2014-06-01 (×2): qty 1

## 2014-06-01 SURGICAL SUPPLY — 54 items
BIT DRILL TAK (DRILL) IMPLANT
BLADE AVERAGE 25MMX9MM (BLADE)
BLADE AVERAGE 25X9 (BLADE) IMPLANT
BLADE CUDA 5.5 (BLADE) ×2 IMPLANT
BLADE GREAT WHITE 4.2 (BLADE) ×3 IMPLANT
BLADE GREAT WHITE 4.2MM (BLADE) ×2
BLADE SURG 11 STRL SS (BLADE) ×3 IMPLANT
BUR OVAL 6.0 (BURR) ×5 IMPLANT
CANNULA SHOULDER 7CM (CANNULA) ×3 IMPLANT
DRAPE STERI 35X30 U-POUCH (DRAPES) ×3 IMPLANT
DRAPE SURG 17X23 STRL (DRAPES) ×3 IMPLANT
DRAPE U-SHAPE 47X51 STRL (DRAPES) ×3 IMPLANT
DRILL TAK (DRILL)
DRSG ADAPTIC 3X8 NADH LF (GAUZE/BANDAGES/DRESSINGS) ×3 IMPLANT
DRSG PAD ABDOMINAL 8X10 ST (GAUZE/BANDAGES/DRESSINGS) ×6 IMPLANT
DURAPREP 26ML APPLICATOR (WOUND CARE) ×3 IMPLANT
GLOVE BIOGEL PI IND STRL 8 (GLOVE) ×4 IMPLANT
GLOVE BIOGEL PI INDICATOR 8 (GLOVE) ×8
GLOVE ORTHO TXT STRL SZ7.5 (GLOVE) ×18 IMPLANT
GLOVE SURG ORTHO 8.0 STRL STRW (GLOVE) ×15 IMPLANT
GOWN STRL REUS W/ TWL LRG LVL3 (GOWN DISPOSABLE) ×2 IMPLANT
GOWN STRL REUS W/ TWL XL LVL3 (GOWN DISPOSABLE) ×4 IMPLANT
GOWN STRL REUS W/TWL LRG LVL3 (GOWN DISPOSABLE) ×6
GOWN STRL REUS W/TWL XL LVL3 (GOWN DISPOSABLE) ×12
KIT BASIN OR (CUSTOM PROCEDURE TRAY) ×3 IMPLANT
KIT ROOM TURNOVER OR (KITS) ×3 IMPLANT
MANIFOLD NEPTUNE II (INSTRUMENTS) ×3 IMPLANT
NDL SPNL 18GX3.5 QUINCKE PK (NEEDLE) ×1 IMPLANT
NDL SUT 6 .5 CRC .975X.05 MAYO (NEEDLE) IMPLANT
NEEDLE 22X1 1/2 (OR ONLY) (NEEDLE) ×3 IMPLANT
NEEDLE MAYO TAPER (NEEDLE)
NEEDLE SPNL 18GX3.5 QUINCKE PK (NEEDLE) ×3 IMPLANT
NS IRRIG 1000ML POUR BTL (IV SOLUTION) ×3 IMPLANT
PACK SHOULDER (CUSTOM PROCEDURE TRAY) ×3 IMPLANT
PAD ARMBOARD 7.5X6 YLW CONV (MISCELLANEOUS) ×6 IMPLANT
SET ARTHROSCOPY TUBING (MISCELLANEOUS) ×3
SET ARTHROSCOPY TUBING LN (MISCELLANEOUS) ×1 IMPLANT
SLING ARM IMMOBILIZER LRG (SOFTGOODS) ×2 IMPLANT
SPEAR FASTAKII (SLEEVE) IMPLANT
SPONGE GAUZE 4X4 12PLY (GAUZE/BANDAGES/DRESSINGS) ×6 IMPLANT
SPONGE GAUZE 4X4 12PLY STER LF (GAUZE/BANDAGES/DRESSINGS) ×2 IMPLANT
SPONGE LAP 4X18 X RAY DECT (DISPOSABLE) ×6 IMPLANT
SUT ETHILON 3 0 PS 1 (SUTURE) ×3 IMPLANT
SUT FIBERWIRE #2 38 T-5 BLUE (SUTURE) ×3
SUT MNCRL AB 3-0 PS2 18 (SUTURE) IMPLANT
SUT VIC AB 2-0 CT1 27 (SUTURE)
SUT VIC AB 2-0 CT1 TAPERPNT 27 (SUTURE) IMPLANT
SUTURE FIBERWR #2 38 T-5 BLUE (SUTURE) ×1 IMPLANT
SYR 20ML ECCENTRIC (SYRINGE) ×3 IMPLANT
SYR CONTROL 10ML LL (SYRINGE) ×3 IMPLANT
TOWEL OR 17X24 6PK STRL BLUE (TOWEL DISPOSABLE) ×3 IMPLANT
TOWEL OR 17X26 10 PK STRL BLUE (TOWEL DISPOSABLE) ×3 IMPLANT
WAND HAND CNTRL MULTIVAC 90 (MISCELLANEOUS) ×2 IMPLANT
WATER STERILE IRR 1000ML POUR (IV SOLUTION) ×3 IMPLANT

## 2014-06-01 NOTE — Anesthesia Preprocedure Evaluation (Addendum)
Anesthesia Evaluation  Patient identified by MRN, date of birth, ID band Patient awake    Reviewed: Allergy & Precautions, NPO status , Patient's Chart, lab work & pertinent test results  History of Anesthesia Complications Negative for: history of anesthetic complications  Airway Mallampati: II TM Distance: >3 FB Neck ROM: Full    Dental  (+) Teeth Intact, Dental Advisory Given   Pulmonary former smoker,  breath sounds clear to auscultation        Cardiovascular Rhythm:Regular Rate:Normal  Heart surgery as a child. Afib ablation.   Neuro/Psych negative neurological ROS  negative psych ROS   GI/Hepatic negative GI ROS, Neg liver ROS,   Endo/Other  negative endocrine ROS  Renal/GU negative Renal ROS     Musculoskeletal   Abdominal   Peds  Hematology   Anesthesia Other Findings   Reproductive/Obstetrics negative OB ROS                          Anesthesia Physical Anesthesia Plan  ASA: II  Anesthesia Plan: General   Post-op Pain Management:    Induction: Intravenous  Airway Management Planned: Oral ETT  Additional Equipment:   Intra-op Plan:   Post-operative Plan:   Informed Consent: I have reviewed the patients History and Physical, chart, labs and discussed the procedure including the risks, benefits and alternatives for the proposed anesthesia with the patient or authorized representative who has indicated his/her understanding and acceptance.   Dental advisory given  Plan Discussed with: CRNA and Anesthesiologist  Anesthesia Plan Comments: (Impingement R. Shoulder H/O A flutter S/P ablation in 2007 maintaining SR HPlan GA with interscalene block/O repair congenital hear defect a child, PS vs. TOF ECHO 7/27: moderate PI mild TR, MR EF 65% PASP 30 mmHg  Plan GA with interscalene block  Kipp Broodavid Joslin)       Anesthesia Quick Evaluation

## 2014-06-01 NOTE — Transfer of Care (Signed)
Immediate Anesthesia Transfer of Care Note  Patient: Joshua Curry  Procedure(s) Performed: Procedure(s): RIGHT SHOULDER ARTHROSCOPY WITH DEBRIDEMENT, Acromioplasty distal incision (Right)  Patient Location: PACU  Anesthesia Type:General  Level of Consciousness: awake, oriented and patient cooperative  Airway & Oxygen Therapy: Patient Spontanous Breathing and Patient connected to nasal cannula oxygen  Post-op Assessment: Report given to PACU RN and Post -op Vital signs reviewed and stable  Post vital signs: Reviewed  Complications: No apparent anesthesia complications

## 2014-06-01 NOTE — Interval H&P Note (Signed)
History and Physical Interval Note:  06/01/2014 11:33 AM  Joshua Curry  has presented today for surgery, with the diagnosis of RIGHT SHOULDER CARTILAGE DISORDER,TEAR SLAP LESION,BICIPITAL TENOSYNOVITIS  The various methods of treatment have been discussed with the patient and family. After consideration of risks, benefits and other options for treatment, the patient has consented to  Procedure(s): RIGHT SHOULDER ARTHROSCOPY WITH DEBRIDEMENT, POSSIBLE SLAP REPAIR,POSSIBLE BICEPS TENODESIS, (Right) as a surgical intervention .  The patient's history has been reviewed, patient examined, no change in status, stable for surgery.  I have reviewed the patient's chart and labs.  Questions were answered to the patient's satisfaction.     Joshua Curry,Joshua Curry

## 2014-06-01 NOTE — Anesthesia Postprocedure Evaluation (Signed)
  Anesthesia Post-op Note  Patient: Joshua Curry  Procedure(s) Performed: Procedure(s): RIGHT SHOULDER ARTHROSCOPY WITH DEBRIDEMENT, Acromioplasty distal incision (Right)  Patient Location: PACU  Anesthesia Type:General and GA combined with regional for post-op pain  Level of Consciousness: awake, alert  and oriented  Airway and Oxygen Therapy: Patient Spontanous Breathing and Patient connected to nasal cannula oxygen  Post-op Pain: none  Post-op Assessment: Post-op Vital signs reviewed, Patient's Cardiovascular Status Stable, Respiratory Function Stable, Patent Airway and Pain level controlled  Post-op Vital Signs: stable  Last Vitals:  Filed Vitals:   06/01/14 1453  BP: 136/58  Pulse: 75  Temp: 36.4 C  Resp:     Complications: No apparent anesthesia complications

## 2014-06-01 NOTE — H&P (View-Only) (Signed)
Joshua Curry is an 43 y.o. male.   Chief Complaint: right shoulder pain HPI: Right shoulder pain. He fell going up some steps, pain around the clavicle and anterior aspect of the shoulder. He is relatively stoic. 43 year old does not do physical work. He has occasional clicking. He works a sedentary job. 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  Past Medical History  Diagnosis Date  . 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 Atypical atrial flutter     Status post RFA by Dr. Lovena Curry 2007    Past Surgical History  Procedure Laterality Date  . Congenital heart disease surgery      [place] - childhood  . Ablation      atrial flutter  . Cardiac catheterization  2007    Family History  Problem Relation Age of Onset  . CAD Mother     At age 52   Social History:  reports that he has quit smoking. His smoking use included Cigarettes. He smoked 0.00 packs per day. He has never used smokeless tobacco. He reports that he drinks alcohol. He reports that he does not use illicit drugs.  Allergies: No Known Allergies   (Not in a hospital admission)  Results for orders placed during the hospital encounter of 05/31/14 (from the past 48 hour(s))  BASIC METABOLIC PANEL     Status: Abnormal   Collection Time    05/31/14  9:50 AM      Result Value Ref Range   Sodium 140  137 - 147 mEq/L   Potassium 4.3  3.7 - 5.3 mEq/L   Chloride 101  96 - 112 mEq/L   CO2 24  19 - 32 mEq/L   Glucose, Bld 101 (*) 70 - 99 mg/dL   BUN 13  6 - 23 mg/dL   Creatinine, Ser 0.84  0.50 - 1.35 mg/dL   Calcium 9.2  8.4 - 10.5 mg/dL   GFR calc non Af Amer >90  >90 mL/min   GFR calc Af Amer >90  >90 mL/min   Comment: (NOTE)     The eGFR has been calculated using the CKD EPI equation.     This calculation has not been validated in all clinical situations.     eGFR's persistently <90 mL/min  signify possible Chronic Kidney     Disease.   Anion gap 15  5 - 15  CBC     Status: None   Collection Time    05/31/14  9:50 AM      Result Value Ref Range   WBC 7.6  4.0 - 10.5 K/uL   RBC 5.08  4.22 - 5.81 MIL/uL   Hemoglobin 15.6  13.0 - 17.0 g/dL   HCT 45.1  39.0 - 52.0 %   MCV 88.8  78.0 - 100.0 fL   MCH 30.7  26.0 - 34.0 pg   MCHC 34.6  30.0 - 36.0 g/dL   RDW 13.4  11.5 - 15.5 %   Platelets 303  150 - 400 K/uL   Dg Chest 2 View  05/31/2014   CLINICAL DATA:  Right shoulder surgery.  EXAM: CHEST  2 VIEW  COMPARISON:  CT 02/27/2006.  Chest x-ray 02/27/2006  FINDINGS: Mediastinum hilar structures normal. Lungs are clear. Heart size stable. Prior CABG. Normal pulmonary vascularity. No acute bony abnormality.  IMPRESSION: No active  cardiopulmonary disease.   Electronically Signed   By: Joshua Curry  Register   On: 05/31/2014 10:27    ROS  There were no vitals taken for this visit. Physical Exam  Constitutional: He is oriented to person, place, and time. He appears well-developed and well-nourished. No distress.  HENT:  Head: Normocephalic and atraumatic.  Nose: Nose normal.  Eyes: Conjunctivae and EOM are normal. Pupils are equal, round, and reactive to light.  Neck: Normal range of motion. Neck supple.  Cardiovascular: Normal rate and intact distal pulses.   Respiratory: Effort normal. No respiratory distress.  GI: Soft. He exhibits no distension. There is no tenderness.  Musculoskeletal:       Right shoulder: He exhibits decreased range of motion, tenderness, pain and decreased strength.  Lymphadenopathy:    He has no cervical adenopathy.  Neurological: He is alert and oriented to person, place, and time. No cranial nerve deficit.  Skin: Skin is warm and dry. No rash noted. No erythema.  Psychiatric: He has a normal mood and affect. His behavior is normal.     Assessment/Plan He has a superior labral tear minimal tendinosis partial articular cartilage loss. At this point he  has a superior labral tear which I think is symptomatic, probable candidate for shoulder arthroscopy debridement possible labral repair more likely biceps tenodesis. Risks and benefits discussed with the patient proceed on with scheduling. Went over the findings on MRI all questions answered. Per peri and post-op issues were discussed as well  Joshua Curry, Joshua Curry 05/31/2014, 5:29 PM

## 2014-06-01 NOTE — Brief Op Note (Signed)
06/01/2014  1:21 PM  PATIENT:  Joshua MilchEdward G Kulish  43 y.o. male  PRE-OPERATIVE DIAGNOSIS:  RIGHT SHOULDER CARTILAGE DISORDER,TEAR SLAP LESION,BICIPITAL TENOSYNOVITIS  POST-OPERATIVE DIAGNOSIS:  RIGHT SHOULDER CARTILAGE DISORDER,TEAR SLAP LESION,BICIPITAL TENOSYNOVITIS  PROCEDURE:  Procedure(s): RIGHT SHOULDER ARTHROSCOPY WITH DEBRIDEMENT, Acromioplasty distal incision (Right)  SURGEON:  Surgeon(s) and Role:    * W D Carloyn Manneraffrey Jr., MD - Primary  PHYSICIAN ASSISTANT: Margart SicklesJoshua Alexx Giambra, PA-C  ASSISTANTS:   ANESTHESIA:   regional and general  EBL:  Total I/O In: 1000 [I.V.:1000] Out: -   BLOOD ADMINISTERED:none  DRAINS: none   LOCAL MEDICATIONS USED:  MARCAINE     SPECIMEN:  No Specimen  DISPOSITION OF SPECIMEN:  N/A  COUNTS:  YES  TOURNIQUET:  * No tourniquets in log *  DICTATION: .Other Dictation: Dictation Number unknown  PLAN OF CARE: Admit for overnight observation  PATIENT DISPOSITION:  PACU - hemodynamically stable.   Delay start of Pharmacological VTE agent (>24hrs) due to surgical blood loss or risk of bleeding: not applicable

## 2014-06-01 NOTE — Anesthesia Procedure Notes (Addendum)
Anesthesia Regional Block:  Interscalene brachial plexus block  Pre-Anesthetic Checklist: ,, timeout performed, Correct Patient, Correct Site, Correct Laterality, Correct Procedure, Correct Position, site marked, Risks and benefits discussed,  Surgical consent,  Pre-op evaluation,  At surgeon's request and post-op pain management  Laterality: Right  Prep: chloraprep       Needles:   Needle Type: Echogenic Stimulator Needle     Needle Length: 9cm 9 cm Needle Gauge: 22 and 22 G    Additional Needles:  Procedures: ultrasound guided (picture in chart) and nerve stimulator Interscalene brachial plexus block Narrative:  Start time: 06/01/2014 9:10 AM End time: 06/01/2014 9:15 AM Injection made incrementally with aspirations every 5 mL.  Performed by: Personally   Additional Notes: 25 cc 0.5% Bupivacaine with 1:200 epi injected easily   Procedure Name: Intubation Date/Time: 06/01/2014 12:04 PM Performed by: Romie MinusOCK, Taneeka Curtner K Pre-anesthesia Checklist: Patient identified, Emergency Drugs available, Suction available, Patient being monitored and Timeout performed Patient Re-evaluated:Patient Re-evaluated prior to inductionOxygen Delivery Method: Circle system utilized Preoxygenation: Pre-oxygenation with 100% oxygen Intubation Type: IV induction Ventilation: Mask ventilation without difficulty and Oral airway inserted - appropriate to patient size Laryngoscope Size: Miller and 3 Grade View: Grade I Tube type: Oral Tube size: 7.5 mm Number of attempts: 1 Airway Equipment and Method: Stylet Placement Confirmation: ETT inserted through vocal cords under direct vision,  positive ETCO2,  CO2 detector and breath sounds checked- equal and bilateral Secured at: 22 cm Tube secured with: Tape Dental Injury: Teeth and Oropharynx as per pre-operative assessment

## 2014-06-01 NOTE — Op Note (Signed)
NAMPark Breed:  Mcauley, Quang               ACCOUNT NO.:  [id number]  MEDICAL RECORD NO.:  [id number]  LOCATION:  [id number]                        FACILITY:  [facility]  PHYSICIAN:  Dyke BrackettW. D. Manuelita Moxon, M.D.    DATE OF BIRTH:  [birth year]  DATE OF PROCEDURE: DATE OF DISCHARGE:                              OPERATIVE REPORT   INDICATIONS:  A 43 year old, right-hand dominant patient has suffered a traumatic injury to his shoulder and denied any pre-existing shoulder pain, had a subacromial injection,  some physical therapy, lack of improvement.  He had an MR arthrogram.  We suggested a post SLAP tear and was it thought that he was a candidate for arthroscopy based on the fact that he had persistent pain.  PREOPERATIVE DIAGNOSES: 1. Superior labral tear postop, superior posterior labral tear     (degenerative). 2. Grade 3 glenohumeral arthritis. 3. Impingement.  OPERATION: 1. Intra-articular debridement, torn labrum and glenohumeral     arthritis. 2. Arthroscopic acromioplasty, right shoulder.  SURGEON:  Dyke BrackettW. D. Akeiba Axelson, M.D.  ASSISTANT:  Margart SicklesJoshua Chadwell, PA-C.  ANESTHESIA:  General nerve block with minimal blood loss.  DESCRIPTION OF PROCEDURE:  He was examined under anesthesia.  He actually under anesthesia had loss of internal and external rotation of the shoulder.  He had no instability.  He was arthroscoped for the posterior lateral, anterior portal.  The anterior labrum and biceps anchor basically were intact.  There was some fraying of the labrum at the biceps attachment, it was really not true SLAP tear for instability sake.  There was significant degenerative tearing of the posterior and inferior labrum which was digressively debrided.  Underlying this, there is a posterior one-third and inferior one-third of the glenoid, there was advanced grade 3 changes and I would estimate approximately 50% of the articular surface of the humeral head, had loose articular cartilage advanced grade 2 and  grade 3, but no grade 4 changes.  This was aggressively debrided.  The undersurface rotator cuff was normal.  Subacromial space was hypertrophied and inflamed, bursectomy and release of CA ligament, mild acromioplasty resecting 3-4 mm anterior wedge of acromion.  Superior surface of the cuff showed an abrasion type phenomenon, but nothing nearly approaching any full-thickness tear.  Shoulder drained free of fluid.  Portals closed with nylon intra-articularly and infiltrated the joint with 5 mL of Marcaine and 80 mg Depo-Medrol. Taken to recovery room in stable condition.     Dyke BrackettW. D. Terilynn Buresh, M.D.     WDC/MEDQ  D:  06/01/2014  T:  06/01/2014  Job:  [id number]

## 2014-06-01 NOTE — Discharge Instructions (Signed)
What to eat:  For your first meals, you should eat lightly; only small meals initially.  If you do not have nausea, you may eat larger meals.  Avoid spicy, greasy and heavy food.    General Anesthesia, Adult, Care After  Refer to this sheet in the next few weeks. These instructions provide you with information on caring for yourself after your procedure. Your health care provider may also give you more specific instructions. Your treatment has been planned according to current medical practices, but problems sometimes occur. Call your health care provider if you have any problems or questions after your procedure.  WHAT TO EXPECT AFTER THE PROCEDURE  After the procedure, it is typical to experience:  Sleepiness.  Nausea and vomiting. HOME CARE INSTRUCTIONS  For the first 24 hours after general anesthesia:  Have a responsible person with you.  Do not drive a car. If you are alone, do not take public transportation.  Do not drink alcohol.  Do not take medicine that has not been prescribed by your health care provider.  Do not sign important papers or make important decisions.  You may resume a normal diet and activities as directed by your health care provider.  Change bandages (dressings) as directed.  If you have questions or problems that seem related to general anesthesia, call the hospital and ask for the anesthetist or anesthesiologist on call. SEEK MEDICAL CARE IF:  You have nausea and vomiting that continue the day after anesthesia.  You develop a rash. SEEK IMMEDIATE MEDICAL CARE IF:  You have difficulty breathing.  You have chest pain.  You have any allergic problems. Document Released: 01/25/2001 Document Revised: 06/21/2013 Document Reviewed: 05/04/2013  [facility] Patient Information 2014 [place], [place].   Diet: As you were doing prior to hospitalization   Activity: Increase activity slowly as tolerated  No lifting or driving for 2 weeks   Shower: May shower without a  dressing on post op day #2, NO SOAKING in tub   Dressing: You may change your dressing on post op day #2.  Then change the dressing daily   Weight Bearing: sling, no lifting with right arm.  After 2 days range of motion as tolerated.  To prevent constipation: you may use a stool softener such as -  Colace ( over the counter) 100 mg by mouth twice a day  Drink plenty of fluids ( prune juice may be helpful) and high fiber foods  Miralax ( over the counter) for constipation as needed.   Precautions: If you experience chest pain or shortness of breath - call 911 immediately For transfer to the hospital emergency department!!  If you develop a fever greater that 101 F, purulent drainage from wound, increased redness or drainage from wound, or calf pain -- Call the office   Follow- Up Appointment: Please call for an appointment to be seen in 1 weeks  [place] [phone]

## 2014-06-02 DIAGNOSIS — M24119 Other articular cartilage disorders, unspecified shoulder: Secondary | ICD-10-CM | POA: Diagnosis not present

## 2014-06-02 NOTE — Discharge Summary (Signed)
Physician Discharge Summary  Patient ID: Joshua Curry MRN: [id number] DOB/AGE: June 23, 1971 43 y.o.  Admit date: 06/01/2014 Discharge date: 06/02/2014  Admission Diagnoses:  1. Superior labral tear postop, superior posterior labral tear  (degenerative).  2. Grade 3 glenohumeral arthritis.  3. Impingement.   Discharge Diagnoses:  1. Superior labral tear postop, superior posterior labral tear  (degenerative).  2. Grade 3 glenohumeral arthritis.  3. Impingement.   Past Medical History  Diagnosis Date  . Congenital heart disease     Reportedly either Tetralogy of Fallot repair or pulmonic stenosis repair in childhood [facility] - details not clear  . Atypical atrial flutter     Status post RFA by Dr. Ladona Ridgelaylor 2007    Surgeries: Procedure(s): RIGHT SHOULDER ARTHROSCOPY WITH DEBRIDEMENT, Acromioplasty distal incision on 06/01/2014   Consultants (if any):    Discharged Condition: Improved  Hospital Course: Joshua Sidledward G Schreier is an 43 y.o. male who was admitted 06/01/2014 with a diagnosis of 1. Superior labral tear postop, superior posterior labral tear  (degenerative).  2. Grade 3 glenohumeral arthritis.  3. Impingement.  and went to the operating room on 06/01/2014 and underwent the above named procedures.    He was given perioperative antibiotics:  Anti-infectives   Start     Dose/Rate Route Frequency Ordered Stop   06/01/14 1800  ceFAZolin (ANCEF) IVPB 2 g/50 mL premix     2 g 100 mL/hr over 30 Minutes Intravenous Every 6 hours 06/01/14 1447 06/02/14 0601   06/01/14 0600  ceFAZolin (ANCEF) IVPB 2 g/50 mL premix     2 g 100 mL/hr over 30 Minutes Intravenous On call to O.R. 05/31/14 1257 06/01/14 1205    .  He was given sequential compression devices, early ambulation,  for DVT prophylaxis.  He benefited maximally from the hospital stay and there were no complications.    Recent vital signs:  Filed Vitals:   06/02/14 0449  BP: 135/69  Pulse: 53  Temp: 98.2 F (36.8 C)   Resp: 20    Recent laboratory studies:  Lab Results  Component Value Date   HGB 15.6 05/31/2014   Lab Results  Component Value Date   WBC 7.6 05/31/2014   PLT 303 05/31/2014   No results found for this basename: INR   Lab Results  Component Value Date   NA 140 05/31/2014   K 4.3 05/31/2014   CL 101 05/31/2014   CO2 24 05/31/2014   BUN 13 05/31/2014   CREATININE 0.84 05/31/2014   GLUCOSE 101* 05/31/2014    Discharge Medications:     Medication List    Notice   You have not been prescribed any medications.     He has a script for oxycodone given by Dr. Madelon Lipsaffrey.  Diagnostic Studies: Dg Chest 2 View  05/31/2014   CLINICAL DATA:  Right shoulder surgery.  EXAM: CHEST  2 VIEW  COMPARISON:  CT 02/27/2006.  Chest x-ray 02/27/2006  FINDINGS: Mediastinum hilar structures normal. Lungs are clear. Heart size stable. Prior CABG. Normal pulmonary vascularity. No acute bony abnormality.  IMPRESSION: No active cardiopulmonary disease.   Electronically Signed   By: Maisie Fushomas  Register   On: 05/31/2014 10:27    Disposition: home        Follow-up Information   Follow up with CAFFREY JR,W D, MD. Schedule an appointment as soon as possible for a visit in 1 week.   Specialty:  Orthopedic Surgery   Contact information:   [address] [place]  [place] [address] [phone]        Signed: Tally Mckinnon P 06/02/2014, 10:59 AM

## 2014-06-04 ENCOUNTER — Encounter (HOSPITAL_COMMUNITY): Payer: Self-pay | Admitting: Orthopedic Surgery

## 2015-08-14 IMAGING — CR DG CHEST 2V
2 series · 2 of 2 positions shown · non-contrast
Comparison: CT 02/27/2006.  Chest x-ray 02/27/2006

CLINICAL DATA: Right shoulder surgery.

EXAM:
CHEST  2 VIEW

[w chest pa]
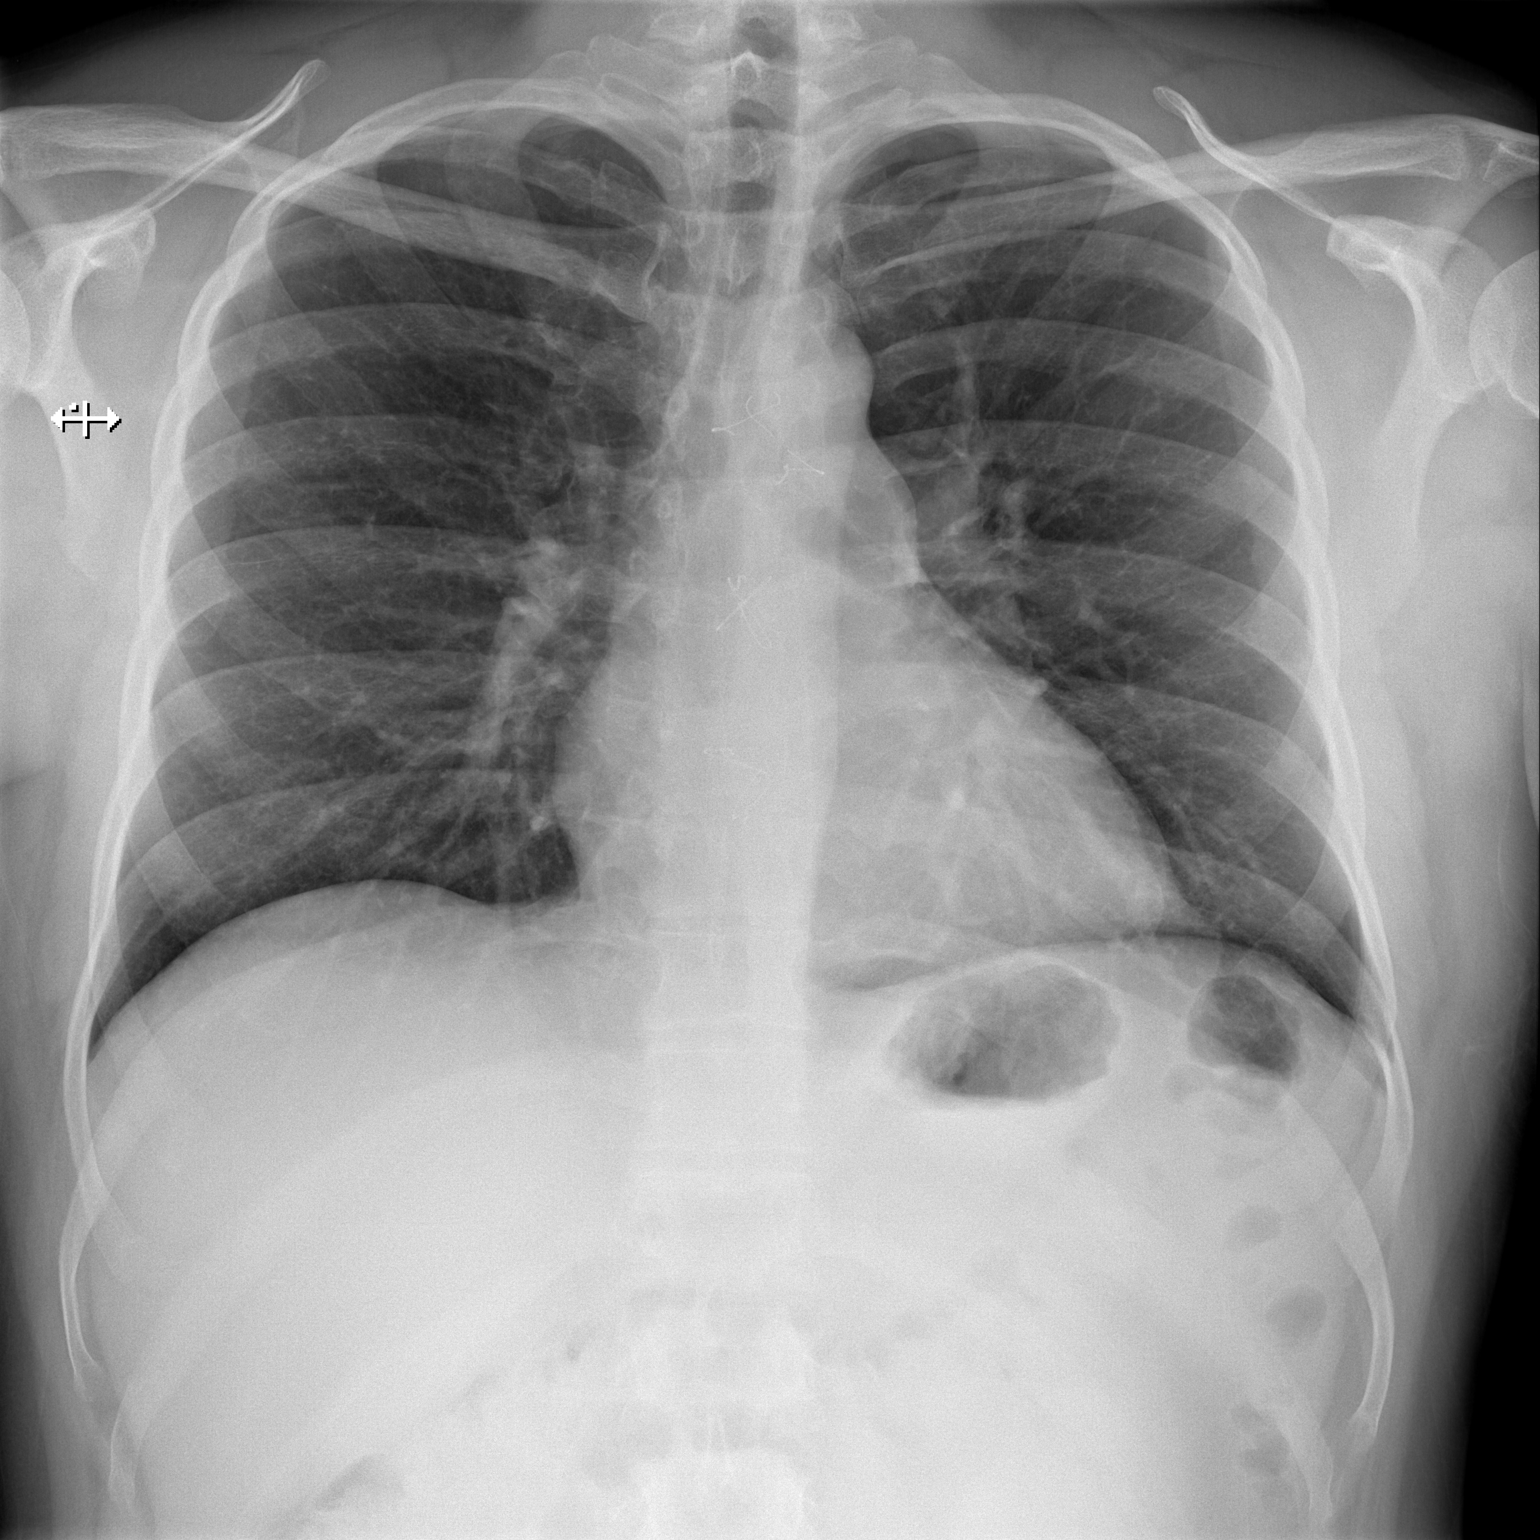

[w chest lat]
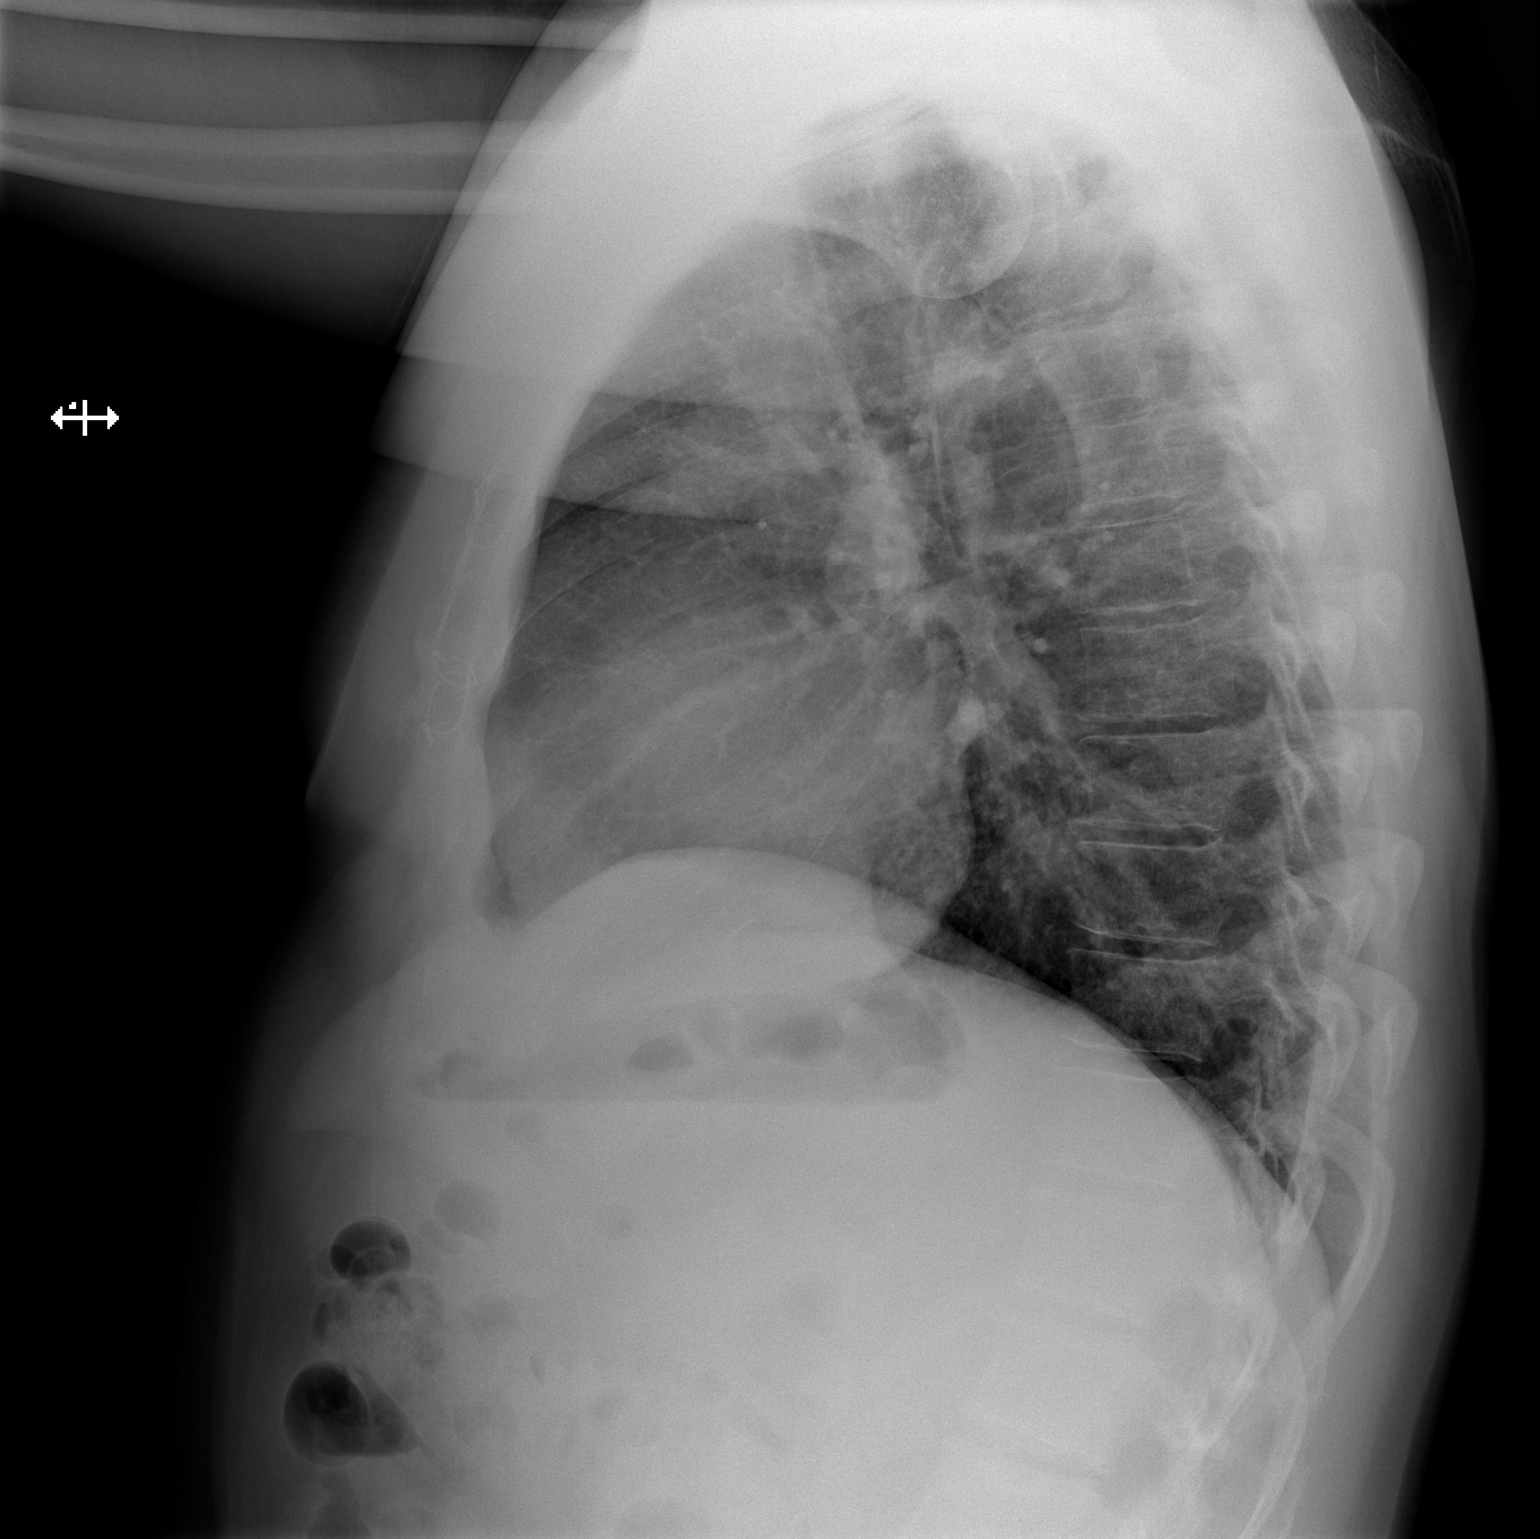

[2 of 2 positions shown; findings below may reference images not displayed]

FINDINGS: Mediastinum hilar structures normal. Lungs are clear. Heart size
stable. Prior CABG. Normal pulmonary vascularity. No acute bony
abnormality.
IMPRESSION: No active cardiopulmonary disease.

## 2015-08-27 ENCOUNTER — Ambulatory Visit (INDEPENDENT_AMBULATORY_CARE_PROVIDER_SITE_OTHER): Payer: BLUE CROSS/BLUE SHIELD | Admitting: *Deleted

## 2015-08-27 DIAGNOSIS — Z23 Encounter for immunization: Secondary | ICD-10-CM

## 2016-08-16 ENCOUNTER — Emergency Department (HOSPITAL_COMMUNITY): Payer: BLUE CROSS/BLUE SHIELD

## 2016-08-16 ENCOUNTER — Emergency Department (HOSPITAL_COMMUNITY)
Admission: EM | Admit: 2016-08-16 | Discharge: 2016-08-16 | Disposition: A | Discharge status: Home / Self Care | Payer: BLUE CROSS/BLUE SHIELD | Attending: Emergency Medicine | Admitting: Emergency Medicine

## 2016-08-16 ENCOUNTER — Encounter (HOSPITAL_COMMUNITY): Payer: Self-pay

## 2016-08-16 DIAGNOSIS — Y92000 Kitchen of unspecified non-institutional (private) residence as  the place of occurrence of the external cause: Secondary | ICD-10-CM | POA: Diagnosis not present

## 2016-08-16 DIAGNOSIS — Z87891 Personal history of nicotine dependence: Secondary | ICD-10-CM | POA: Insufficient documentation

## 2016-08-16 DIAGNOSIS — X509XXA Other and unspecified overexertion or strenuous movements or postures, initial encounter: Secondary | ICD-10-CM | POA: Insufficient documentation

## 2016-08-16 DIAGNOSIS — Y999 Unspecified external cause status: Secondary | ICD-10-CM | POA: Insufficient documentation

## 2016-08-16 DIAGNOSIS — Y9389 Activity, other specified: Secondary | ICD-10-CM | POA: Diagnosis not present

## 2016-08-16 DIAGNOSIS — S8991XA Unspecified injury of right lower leg, initial encounter: Secondary | ICD-10-CM | POA: Insufficient documentation

## 2016-08-16 DIAGNOSIS — S8990XA Unspecified injury of unspecified lower leg, initial encounter: Secondary | ICD-10-CM

## 2016-08-16 LAB — CBC WITH DIFFERENTIAL/PLATELET
Basophils Absolute: 0 10*3/uL (ref 0.0–0.1)
Basophils Relative: 0 %
EOS ABS: 0 10*3/uL (ref 0.0–0.7)
Eosinophils Relative: 0 %
HCT: 42.8 % (ref 39.0–52.0)
Hemoglobin: 14.5 g/dL (ref 13.0–17.0)
LYMPHS ABS: 1.4 10*3/uL (ref 0.7–4.0)
Lymphocytes Relative: 13 %
MCH: 29.4 pg (ref 26.0–34.0)
MCHC: 33.9 g/dL (ref 30.0–36.0)
MCV: 86.8 fL (ref 78.0–100.0)
MONO ABS: 0.7 10*3/uL (ref 0.1–1.0)
MONOS PCT: 7 %
Neutro Abs: 8.2 10*3/uL — ABNORMAL HIGH (ref 1.7–7.7)
Neutrophils Relative %: 80 %
PLATELETS: 289 10*3/uL (ref 150–400)
RBC: 4.93 MIL/uL (ref 4.22–5.81)
RDW: 13.2 % (ref 11.5–15.5)
WBC: 10.4 10*3/uL (ref 4.0–10.5)

## 2016-08-16 LAB — COMPREHENSIVE METABOLIC PANEL
ALT: 25 U/L (ref 17–63)
ANION GAP: 9 (ref 5–15)
AST: 21 U/L (ref 15–41)
Albumin: 3.9 g/dL (ref 3.5–5.0)
Alkaline Phosphatase: 38 U/L (ref 38–126)
BUN: 11 mg/dL (ref 6–20)
CHLORIDE: 107 mmol/L (ref 101–111)
CO2: 23 mmol/L (ref 22–32)
Calcium: 9 mg/dL (ref 8.9–10.3)
Creatinine, Ser: 0.82 mg/dL (ref 0.61–1.24)
GFR calc non Af Amer: >60 mL/min (ref >60)
Glucose, Bld: 111 mg/dL — ABNORMAL HIGH (ref 65–99)
POTASSIUM: 3.9 mmol/L (ref 3.5–5.1)
SODIUM: 139 mmol/L (ref 135–145)
Total Bilirubin: 0.6 mg/dL (ref 0.3–1.2)
Total Protein: 6.5 g/dL (ref 6.5–8.1)

## 2016-08-16 MED ORDER — MORPHINE SULFATE (PF) 4 MG/ML IV SOLN
4.0000 mg | Freq: Once | INTRAVENOUS | Status: AC
  Administered 2016-08-16: 4 mg via INTRAVENOUS
  Filled 2016-08-16: qty 1

## 2016-08-16 MED ORDER — IBUPROFEN 600 MG PO TABS: 600.0000 mg | ORAL_TABLET | Freq: Four times a day (QID) | ORAL | 0 refills | Status: AC | PRN

## 2016-08-16 MED ORDER — OXYCODONE-ACETAMINOPHEN 5-325 MG PO TABS: 1.0000 | ORAL_TABLET | ORAL | 0 refills | Status: AC | PRN

## 2016-08-16 MED ORDER — OXYCODONE-ACETAMINOPHEN 5-325 MG PO TABS
1.0000 | ORAL_TABLET | Freq: Once | ORAL | Status: AC
  Administered 2016-08-16: 1 via ORAL
  Filled 2016-08-16: qty 1

## 2016-08-16 NOTE — ED Provider Notes (Signed)
[facility]-EMERGENCY DEPT Provider Note   CSN: [id number] Arrival date & time: 08/16/16  1134     History   Chief Complaint Chief Complaint  Patient presents with  . Leg Injury    HPI Joshua Curry is a 45 y.o. male.  HPI Patient complains of sudden onset right calf and ankle pain while chasing a dog around the kitchen. States he heard a pop. Has been unable to ambulate since. Unable to flex and extend the right ankle. No pain in the right knee. Denies any numbness or tingling. Patient then had a brief witnessed syncopal episode. No trauma associated with syncope. Denied chest pain or shortness of breath. Past Medical History:  Diagnosis Date  . Atypical atrial flutter (HCC)    Status post RFA by Dr. Ladona Ridgel 2007  . 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 Preoperative clearance 05/28/2014  . Congenital heart disease 05/28/2014  . Atypical atrial flutter (HCC) 05/28/2014    Past Surgical History:  Procedure Laterality Date  . ABLATION     atrial flutter  . CARDIAC CATHETERIZATION  2007  . Congenital heart disease surgery     [place] - childhood  . SHOULDER ARTHROSCOPY WITH BICEPS TENDON REPAIR Right 06/01/2014   Procedure: RIGHT SHOULDER ARTHROSCOPY WITH DEBRIDEMENT, Acromioplasty distal incision;  Surgeon: Thera Flake., MD;  Location: [facility] OR;  Service: Orthopedics;  Laterality: Right;       Home Medications    Prior to Admission medications   Medication Sig Start Date End Date Taking? Authorizing Provider  ibuprofen (ADVIL,MOTRIN) 600 MG tablet Take 1 tablet (600 mg total) by mouth every 6 (six) hours as needed. 08/16/16   Loren Racer, MD  oxyCODONE-acetaminophen (PERCOCET) 5-325 MG tablet Take 1-2 tablets by mouth every 4 (four) hours as needed. 08/16/16   Loren Racer, MD    Family History Family  History  Problem Relation Age of Onset  . CAD Mother     At age 19    Social History Social History  Substance Use Topics  . Smoking status: Former Smoker    Types: Cigarettes  . Smokeless tobacco: Never Used  . Alcohol use Yes     Comment: Rare     Allergies   Review of patient's allergies indicates no known allergies.   Review of Systems Review of Systems  Constitutional: Negative for chills and fever.  Respiratory: Negative for shortness of breath.   Cardiovascular: Negative for chest pain, palpitations and leg swelling.  Gastrointestinal: Negative for abdominal pain, diarrhea, nausea and vomiting.  Musculoskeletal: Positive for arthralgias and myalgias. Negative for back pain, neck pain and neck stiffness.  Neurological: Positive for syncope and light-headedness. Negative for weakness, numbness and headaches.  All other systems reviewed and are negative.    Physical Exam Updated Vital Signs BP 158/95 (BP Location: Left Arm)   Pulse 81   Temp 98.2 F (36.8 C)   Resp 20   Ht 6\' 1"  (1.854 m)   SpO2 97%   Physical Exam  Constitutional: He is oriented to person, place, and time. He appears well-developed and well-nourished. He appears distressed.  Appears uncomfortable  HENT:  Head: Normocephalic and atraumatic.  Mouth/Throat: Oropharynx is clear and moist.  Eyes: EOM are normal. Pupils are equal, round, and reactive to light.  Neck: Normal range of motion.  Neck supple.  Cardiovascular: Normal rate and regular rhythm.  Exam reveals no gallop and no friction rub.   No murmur heard. Pulmonary/Chest: Effort normal and breath sounds normal.  Abdominal: Soft. Bowel sounds are normal. There is no tenderness. There is no rebound and no guarding.  Musculoskeletal: He exhibits tenderness. He exhibits no edema.  Patient has mild medial tuberosity tenderness to palpation of the right ankle. There is no obvious deformity. He has 2+ dorsalis and posterior tibial pulses  bilaterally. He has decreased dorsiflexion and plantarflexion of the right foot. Negative Thompson's test. No appreciated Achilles tendon formed. Patient does have tenderness to palpation over the right calf. He has full range of motion of the right knee. No ligamentous instability. Right lower extremity is mildly erythematous compared to the left. Good distal blood flow.  Neurological: He is alert and oriented to person, place, and time.  Sensation is grossly intact. Normal EHL/FHL motor strength bilaterally. Equal bilateral upper extremity strength.  Skin: Skin is warm and dry. Capillary refill takes less than 2 seconds. No rash noted. No erythema.  Psychiatric: He has a normal mood and affect. His behavior is normal.  Nursing note and vitals reviewed.    ED Treatments / Results  Labs (all labs ordered are listed, but only abnormal results are displayed) Labs Reviewed  CBC WITH DIFFERENTIAL/PLATELET - Abnormal; Notable for the following:       Result Value   Neutro Abs 8.2 (*)    All other components within normal limits  COMPREHENSIVE METABOLIC PANEL - Abnormal; Notable for the following:    Glucose, Bld 111 (*)    All other components within normal limits    EKG  EKG Interpretation  Date/Time:  Sunday August 16 2016 12:02:46 EDT Ventricular Rate:  77 PR Interval:    QRS Duration: 103 QT Interval:  385 QTC Calculation: 436 R Axis:   1 Text Interpretation:  Sinus rhythm RSR' in V1 or V2, right VCD or RVH Confirmed by Ranae PalmsYELVERTON  MD, Quran Vasco ([id number]) on 08/16/2016 1:48:25 PM       Radiology Dg Tibia/fibula Right  Result Date: 08/16/2016 CLINICAL DATA:  Pt states he was playing with his dog today and "heard something pop" in his right leg. Most pain on the posterior side of right leg. Also states it is a constant pain. Patient was unable to dorsiflex. EXAM: RIGHT TIBIA AND FIBULA - 2 VIEW COMPARISON:  None. FINDINGS: There is no evidence of fracture or other focal bone lesions.  Soft tissues are unremarkable. IMPRESSION: Negative. Electronically Signed   By: Bary RichardStan  Maynard M.D.   On: 08/16/2016 13:00   Dg Ankle Complete Right  Result Date: 08/16/2016 CLINICAL DATA:  Pt states he was playing with his dog today and "heard something pop" in his right leg. Most pain on the posterior side of right leg. Also states it is a constant pain. Patient was unable to dorsiflex. EXAM: RIGHT ANKLE - COMPLETE 3+ VIEW COMPARISON:  None. FINDINGS: There is no evidence of fracture, dislocation, or joint effusion. There is no evidence of arthropathy or other focal bone abnormality. Soft tissues are unremarkable. IMPRESSION: Negative. Electronically Signed   By: Bary RichardStan  Maynard M.D.   On: 08/16/2016 12:59    Procedures Procedures (including critical care time)  Medications Ordered in ED Medications  oxyCODONE-acetaminophen (PERCOCET/ROXICET) 5-325 MG per tablet 1 tablet (not administered)  morphine 4 MG/ML injection 4 mg (4 mg Intravenous Given 08/16/16 1217)     Initial Impression / Assessment  and Plan / ED Course  I have reviewed the triage vital signs and the nursing notes.  Pertinent labs & imaging results that were available during my care of the patient were reviewed by me and considered in my medical decision making (see chart for details).  Clinical Course  Patient with syncope after initial symptoms. Associated with pain. Likely vasovagal. EKG without any abnormalities.  Suspect partial Achilles tendon rupture versus calf muscle rupture. X-rays without acute abnormality. Discussed with Dr. Carola Frost. Recommend Cam walker and crutches. Follow-up with Delbert Harness next week.  Final Clinical Impressions(s) / ED Diagnoses   Final diagnoses:  Injury of calf    New Prescriptions New Prescriptions   IBUPROFEN (ADVIL,MOTRIN) 600 MG TABLET    Take 1 tablet (600 mg total) by mouth every 6 (six) hours as needed.   OXYCODONE-ACETAMINOPHEN (PERCOCET) 5-325 MG TABLET    Take 1-2 tablets  by mouth every 4 (four) hours as needed.     Loren Racer, MD 08/16/16 [phone]

## 2016-08-16 NOTE — ED Notes (Signed)
Ortho paged for cam walker and crutches 

## 2016-08-16 NOTE — ED Notes (Signed)
Patient transported to X-ray 

## 2016-08-16 NOTE — Discharge Instructions (Signed)
Keep elevated. You may take brace off and apply ice. Weightbearing as tolerated. Take medications as prescribed. Call and make an appointment to follow-up with your orthopedist next week.

## 2016-08-16 NOTE — ED Triage Notes (Signed)
Pt reports he was chasing the dog and he heard something pop in his right leg. Right leg appears red in color. Very faint pedal pulse palpated. Leg is also cool to touch. Pt had syncopal event due to pain PTA per wife.

## 2016-08-16 NOTE — Progress Notes (Signed)
Orthopedic Tech Progress Note Patient Details:  Elvina Sidledward G Parkin 10-28-1971 [id number]  Ortho Devices Type of Ortho Device: Crutches, CAM walker Ortho Device/Splint Location: rle Ortho Device/Splint Interventions: Application   Maciej Schweitzer 08/16/2016, 2:12 PM

## 2017-10-30 IMAGING — CR DG ANKLE COMPLETE 3+V*R*
3 series · 3 of 3 positions shown · non-contrast
Comparison: None.

CLINICAL DATA: Pt states he was playing with his dog today and
"heard something pop" in his right leg. Most pain on the posterior
side of right leg. Also states it is a constant pain. Patient was
unable to dorsiflex.

EXAM:
RIGHT ANKLE - COMPLETE 3+ VIEW

[ankle ap]
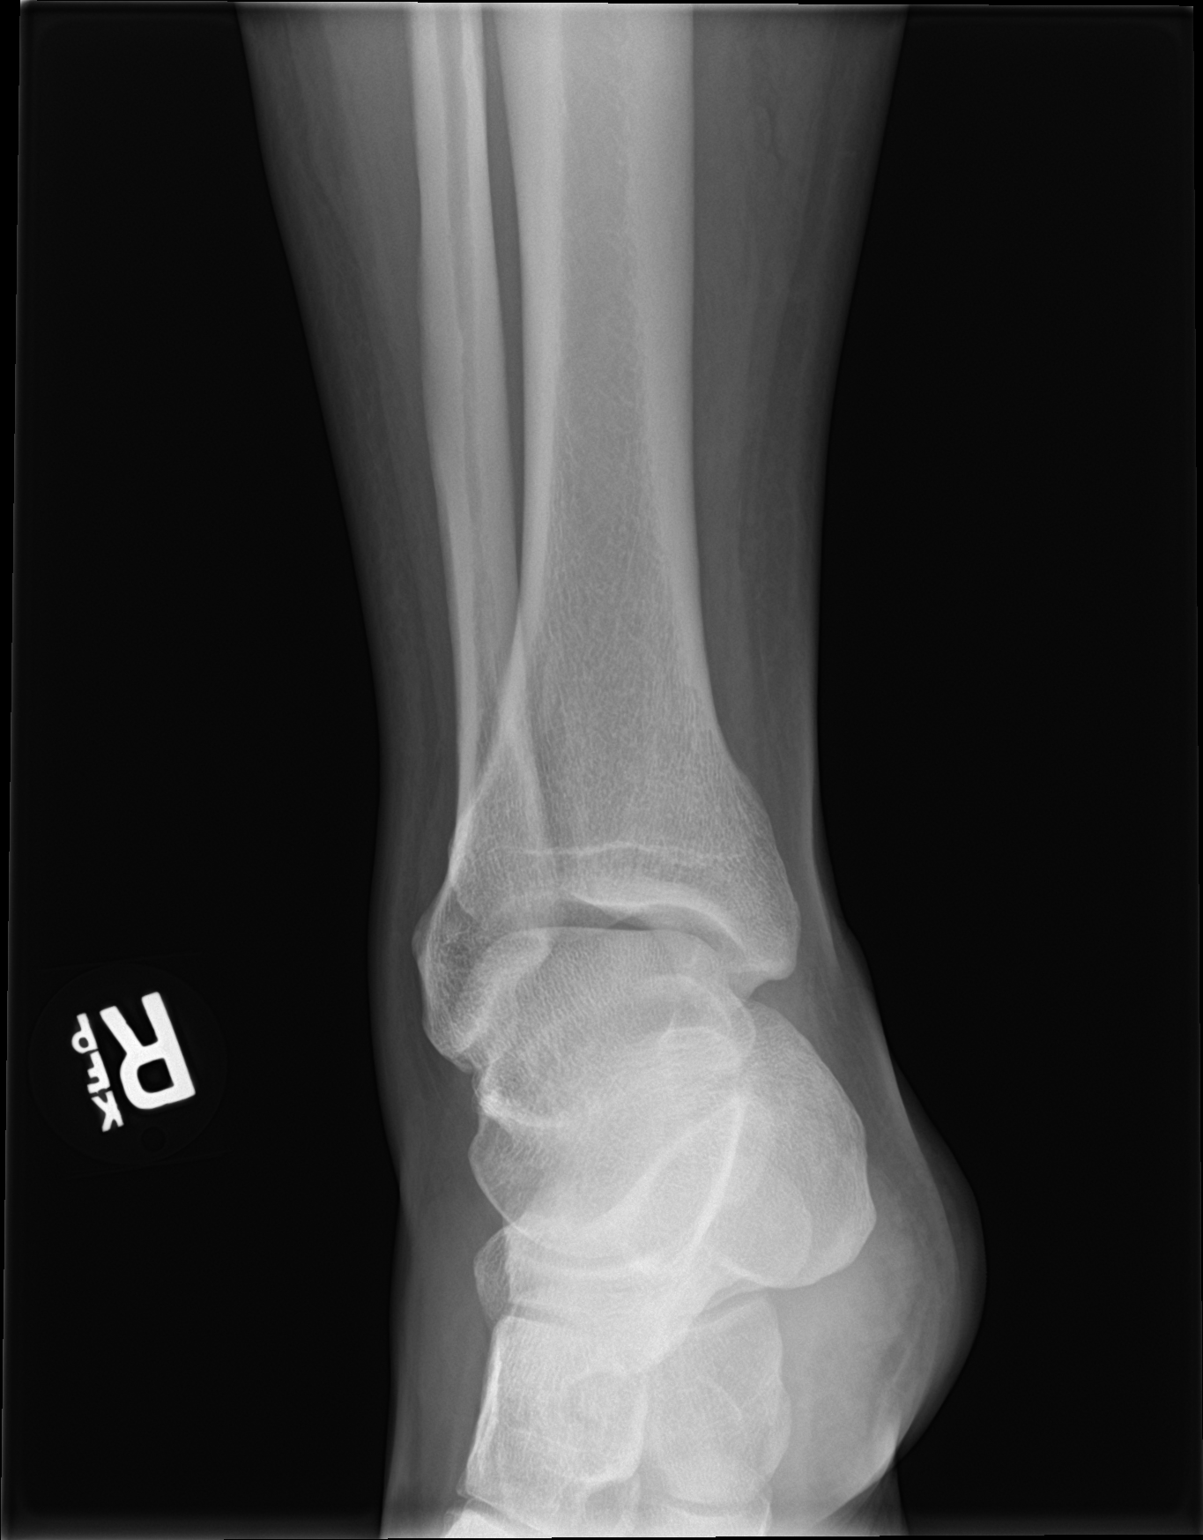

[ankle obl]
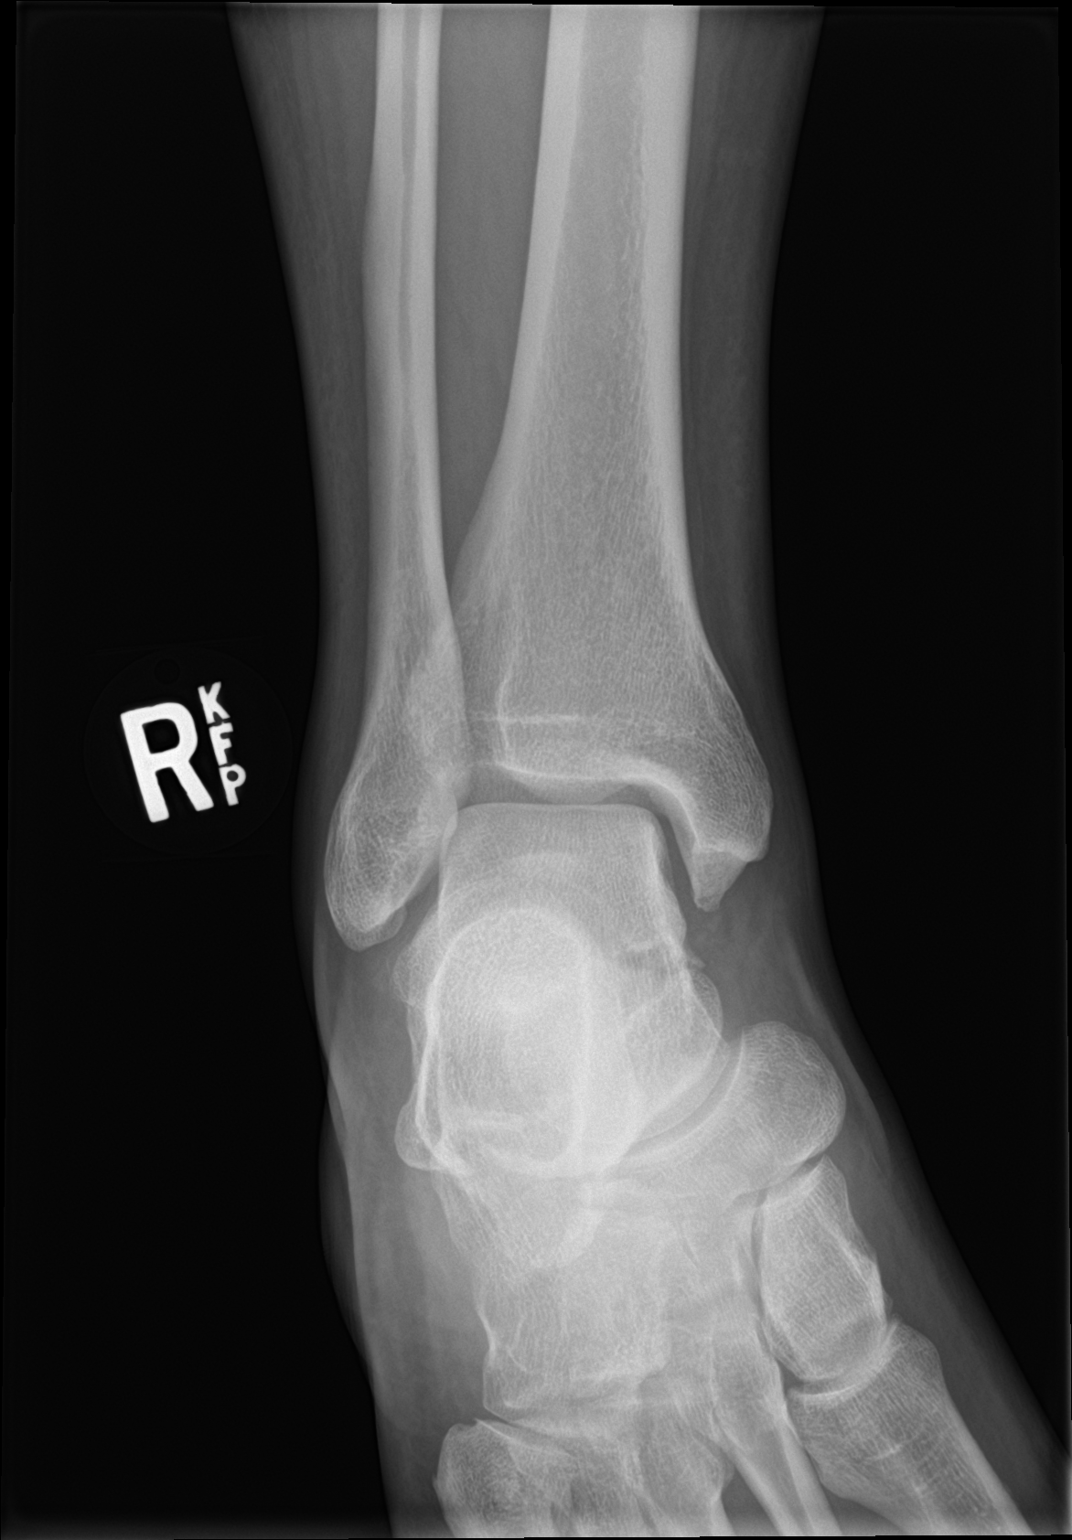

[ankle lat]
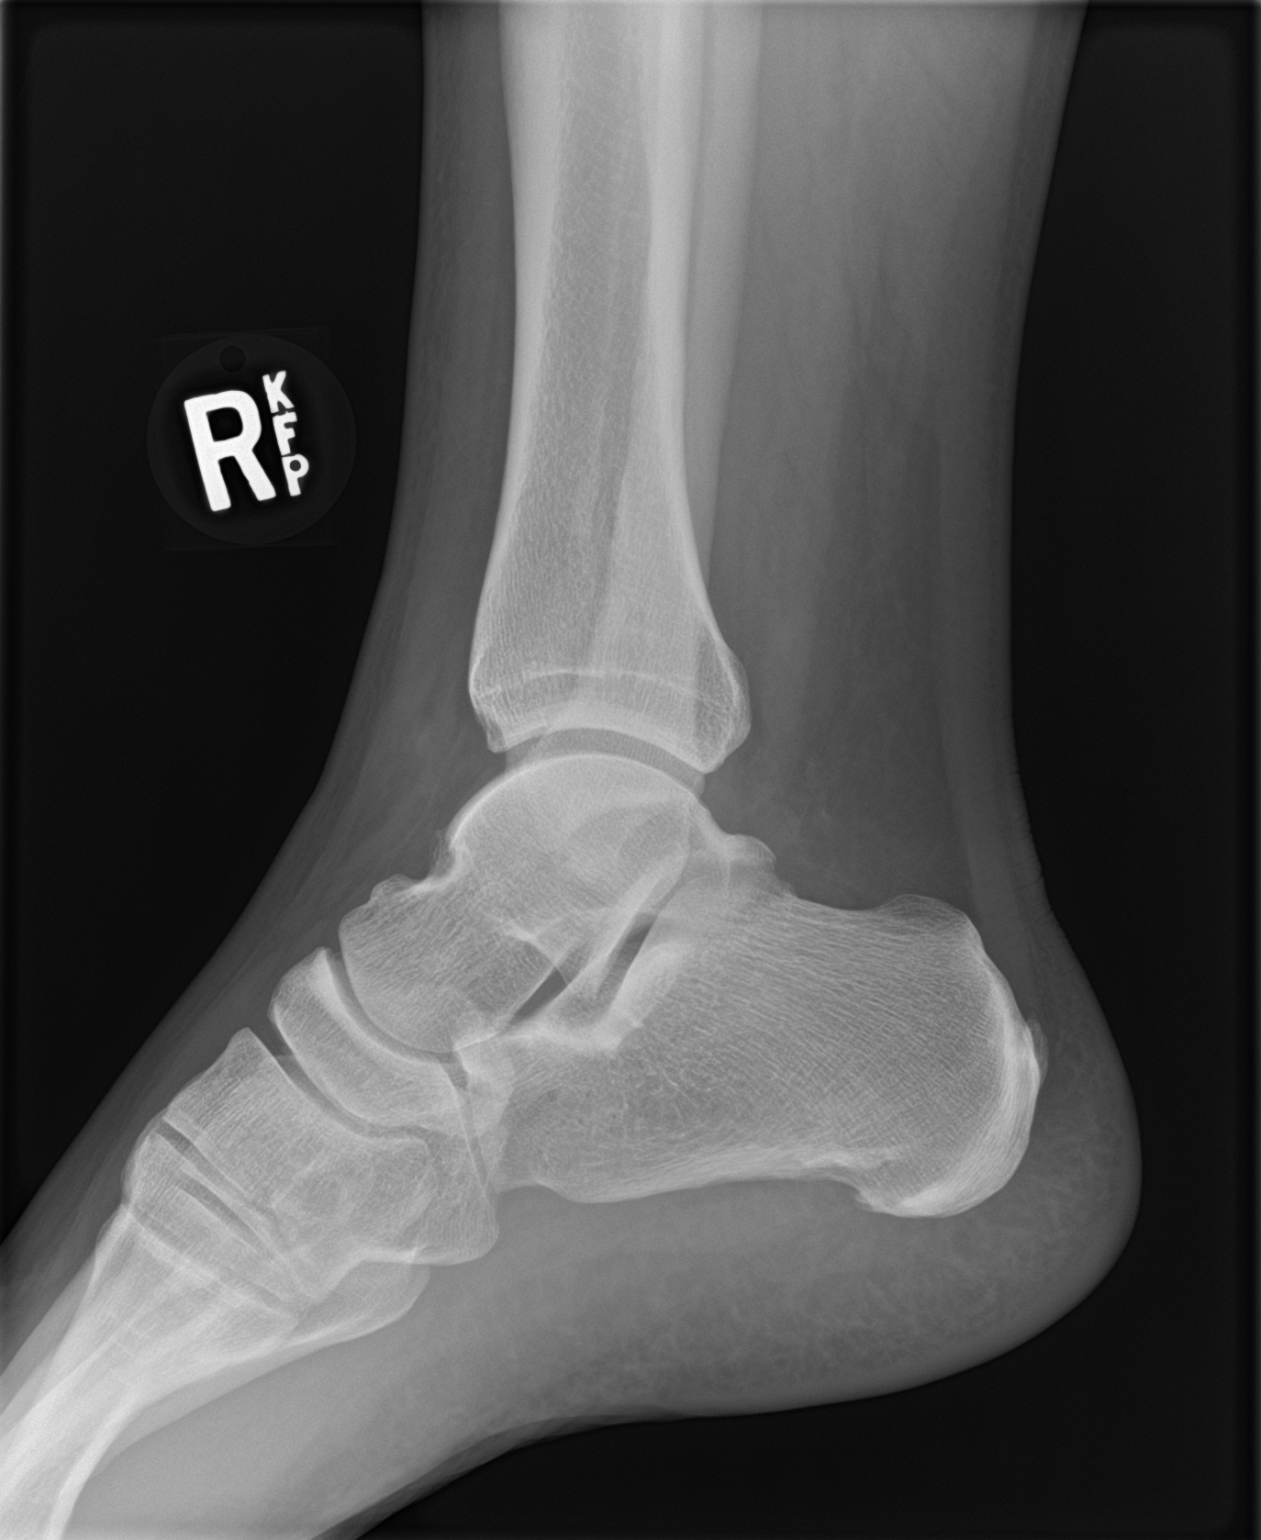

[3 of 3 positions shown; findings below may reference images not displayed]

FINDINGS: There is no evidence of fracture, dislocation, or joint effusion.
There is no evidence of arthropathy or other focal bone abnormality.
Soft tissues are unremarkable.
IMPRESSION: Negative.
